# Patient Record
Sex: Male | Born: 1970 | Race: Black or African American | Hispanic: No | Marital: Married | Smoking: Never smoker
Health system: Southern US, Community
[De-identification: ages and names within clinical notes are randomized; demographics above are authoritative.]

## PROBLEM LIST (undated history)

## (undated) DIAGNOSIS — E119 Type 2 diabetes mellitus without complications: Secondary | ICD-10-CM

## (undated) HISTORY — DX: Type 2 diabetes mellitus without complications: E11.9

---

## 1999-03-23 ENCOUNTER — Emergency Department (HOSPITAL_COMMUNITY): Admission: EM | Admit: 1999-03-23 | Discharge: 1999-03-23 | Payer: Self-pay | Admitting: Emergency Medicine

## 2000-04-09 ENCOUNTER — Ambulatory Visit (HOSPITAL_COMMUNITY): Admission: RE | Admit: 2000-04-09 | Discharge: 2000-04-09 | Payer: Self-pay | Admitting: Specialist

## 2000-04-09 ENCOUNTER — Encounter: Payer: Self-pay | Admitting: Specialist

## 2005-11-25 ENCOUNTER — Emergency Department (HOSPITAL_COMMUNITY): Admission: EM | Admit: 2005-11-25 | Discharge: 2005-11-26 | Payer: Self-pay | Admitting: Emergency Medicine

## 2007-06-22 IMAGING — CR DG SHOULDER 2+V*R*
2 series · 2 of 2 positions shown · non-contrast
Comparison: none

CLINICAL DATA: Trauma, shoulder pain.  The patient was unable to perform external rotation view.  
 RIGHT SHOULDER ? 2 VIEW:

[w shoulder ap internal righ]
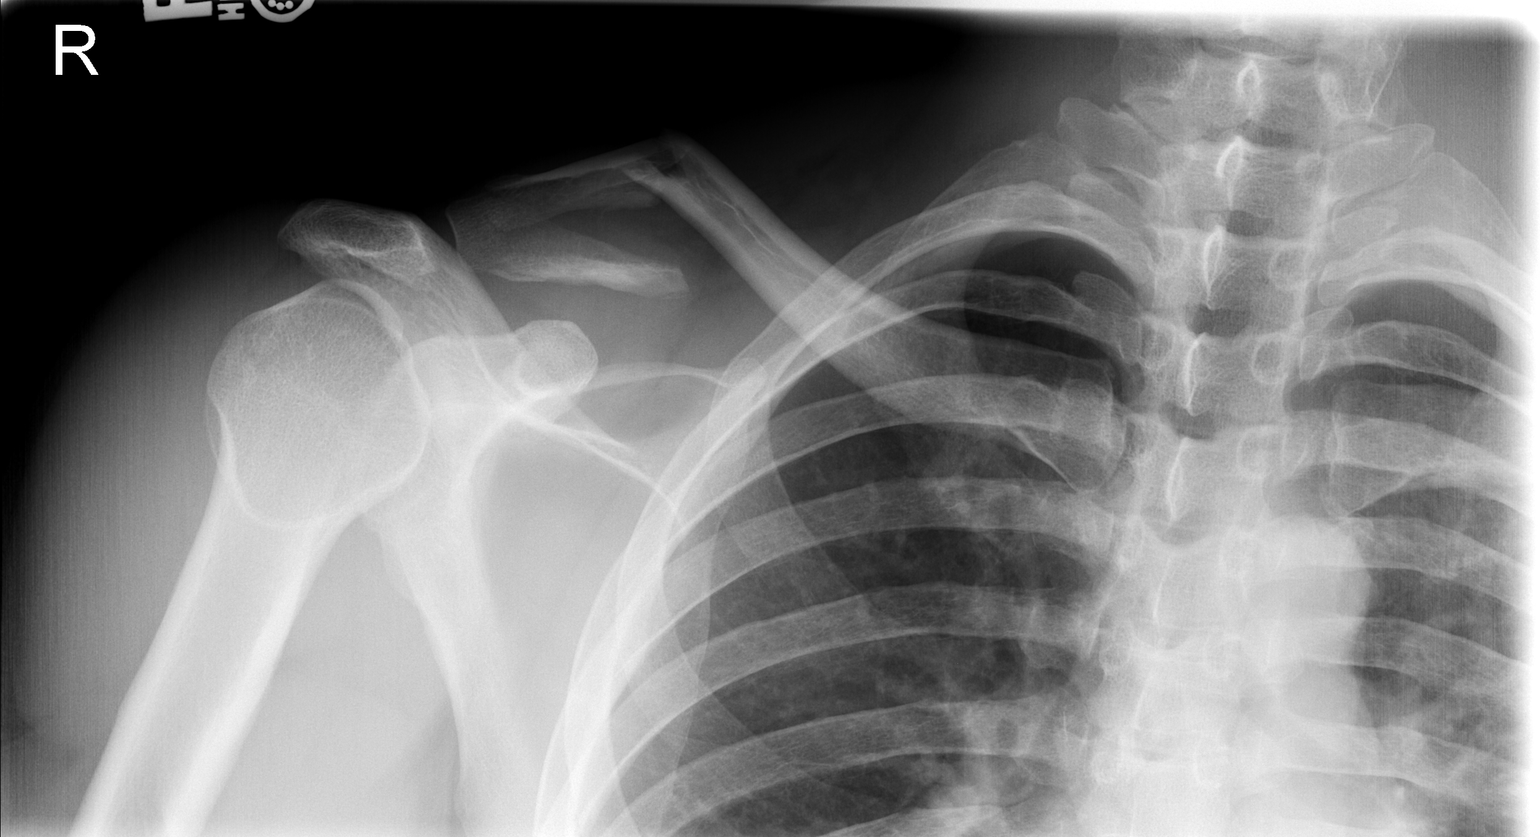

[w shoulder y view right]
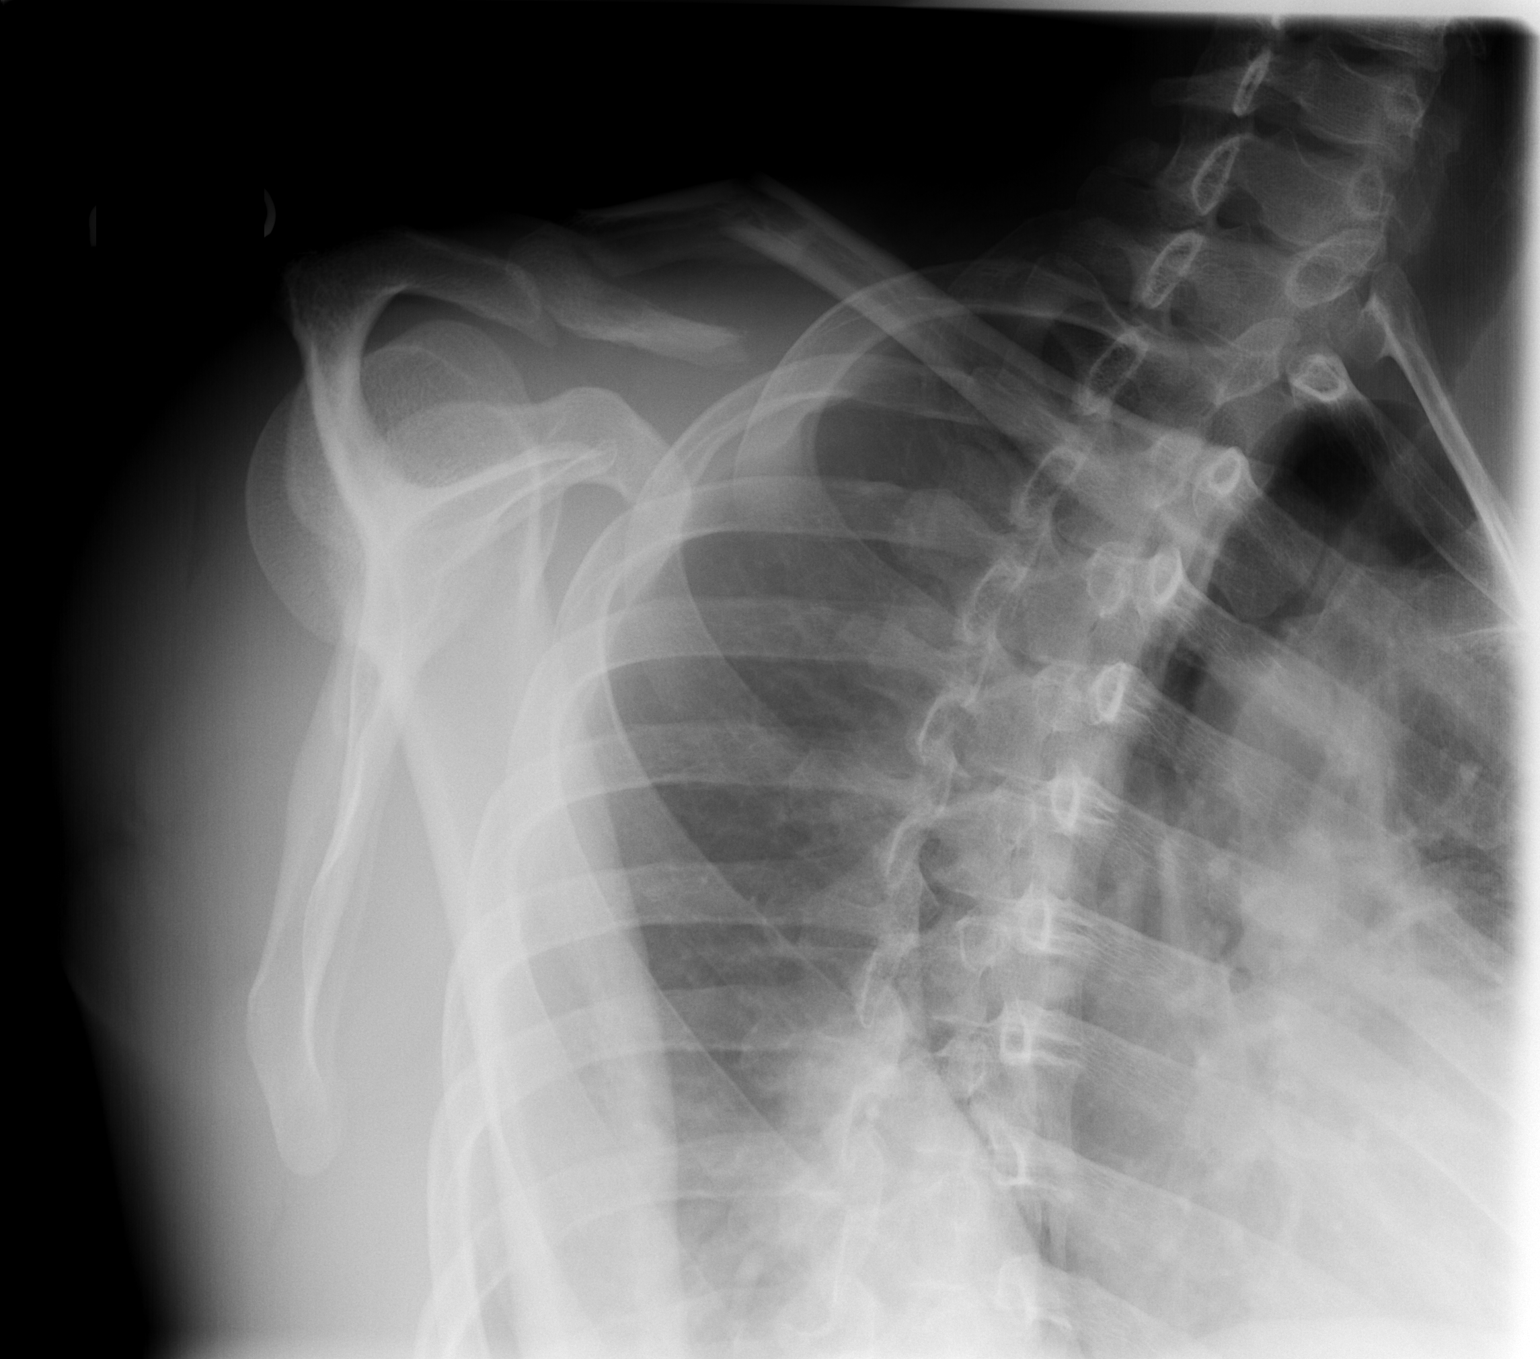

[2 of 2 positions shown; findings below may reference images not displayed]

FINDINGS: There is a distal right clavicular fracture with butterfly fragment noted.  No right apical pneumothorax is seen.  The humeral head is properly located over the glenoid.
IMPRESSION: Distal right clavicular fracture with butterfly fragment.

## 2018-08-05 ENCOUNTER — Ambulatory Visit (INDEPENDENT_AMBULATORY_CARE_PROVIDER_SITE_OTHER): Payer: BLUE CROSS/BLUE SHIELD | Admitting: Family Medicine

## 2018-08-05 ENCOUNTER — Encounter: Payer: Self-pay | Admitting: Family Medicine

## 2018-08-05 VITALS — BP 120/84 | HR 98 | Temp 98.2°F | Resp 12 | Ht 73.0 in | Wt 171.0 lb

## 2018-08-05 DIAGNOSIS — Z23 Encounter for immunization: Secondary | ICD-10-CM

## 2018-08-05 DIAGNOSIS — R634 Abnormal weight loss: Secondary | ICD-10-CM

## 2018-08-05 DIAGNOSIS — Z13220 Encounter for screening for lipoid disorders: Secondary | ICD-10-CM

## 2018-08-05 DIAGNOSIS — E1165 Type 2 diabetes mellitus with hyperglycemia: Secondary | ICD-10-CM | POA: Insufficient documentation

## 2018-08-05 LAB — COMPREHENSIVE METABOLIC PANEL
ALT: 19 U/L (ref 0–53)
AST: 18 U/L (ref 0–37)
Albumin: 4.4 g/dL (ref 3.5–5.2)
Alkaline Phosphatase: 89 U/L (ref 39–117)
BUN: 17 mg/dL (ref 6–23)
CO2: 28 mEq/L (ref 19–32)
Calcium: 9.5 mg/dL (ref 8.4–10.5)
Chloride: 103 mEq/L (ref 96–112)
Creatinine, Ser: 0.95 mg/dL (ref 0.40–1.50)
GFR: 101.69 mL/min (ref >60.00)
Glucose, Bld: 95 mg/dL (ref 70–99)
Potassium: 4.6 mEq/L (ref 3.5–5.1)
Sodium: 137 mEq/L (ref 135–145)
Total Bilirubin: 0.6 mg/dL (ref 0.2–1.2)
Total Protein: 7 g/dL (ref 6.0–8.3)

## 2018-08-05 LAB — HEMOGLOBIN A1C: Hgb A1c MFr Bld: 10.9 % — ABNORMAL HIGH (ref 4.6–6.5)

## 2018-08-05 LAB — LIPID PANEL
Cholesterol: 167 mg/dL (ref 0–200)
HDL: 50.5 mg/dL (ref >39.00)
LDL Cholesterol: 103 mg/dL — ABNORMAL HIGH (ref 0–99)
NonHDL: 116.95
Total CHOL/HDL Ratio: 3
Triglycerides: 70 mg/dL (ref 0.0–149.0)
VLDL: 14 mg/dL (ref 0.0–40.0)

## 2018-08-05 LAB — MICROALBUMIN / CREATININE URINE RATIO
Creatinine,U: 93 mg/dL
Microalb Creat Ratio: 0.8 mg/g (ref 0.0–30.0)
Microalb, Ur: <0.7 mg/dL (ref 0.0–1.9)

## 2018-08-05 MED ORDER — METFORMIN HCL 500 MG PO TABS: 500.0000 mg | ORAL_TABLET | Freq: Two times a day (BID) | ORAL | 3 refills | Status: DC

## 2018-08-05 MED ORDER — DAPAGLIFLOZIN PROPANEDIOL 5 MG PO TABS: 5.0000 mg | ORAL_TABLET | Freq: Every day | ORAL | 3 refills | Status: DC

## 2018-08-05 NOTE — Assessment & Plan Note (Signed)
Newly diagnosed. Educated about diagnosis, prognosis, and treatment options. Reluctant to start new medication, finally he agrees with overall treatment, metformin and [place]. We discussed some side effects of medications. He will continue monitoring BS. Eye exam is current. Follow-up in 3 to 4 months.

## 2018-08-05 NOTE — Progress Notes (Signed)
HPI:   William Rocha is a 48 y.o. male, who is here today to establish care.  Former PCP: N/A Last preventive routine visit: He has not had one in years, recently he had DOT physical. He lives with his wife and 2 children, 77 and 50 years old).  Chronic medical problems: Otherwise healthy until recently when glucose was taken and it was 352. Family history is positive for DM2, parents and sister. He has his DOT physical every 2 years.  When asked about polyuria and polydipsia, he states that he drinks "a lot of" water and therefore he urinates a lot.  States that he drinks about 100 ounces of water per day.  He has noted gradual onset of blurry vision. Last eye exam in 03/2018, history of bilateral nuclear sclerosis.  Abnormal weight loss in the past 6 months, he has lost about 13 pounds.  He is trying to eat healthier, decrease sweet intakes. According to patient, his wife works in the [employer] and has provided some education in regard to diet. He is checking BS, FG 227 and 352.  Bedtime BS 140s to high 200s. He does not exercise regularly.   Review of Systems  Constitutional: Positive for unexpected weight change. Negative for activity change, appetite change, fatigue and fever.  HENT: Negative for nosebleeds and sore throat.   Eyes: Negative for redness and visual disturbance.  Respiratory: Negative for cough, shortness of breath and wheezing.   Cardiovascular: Negative for chest pain, palpitations and leg swelling.  Gastrointestinal: Negative for abdominal pain, nausea and vomiting.  Endocrine: Positive for polydipsia and polyuria. Negative for polyphagia.  Genitourinary: Negative for decreased urine volume, dysuria and hematuria.  Skin: Negative for rash and wound.  Neurological: Negative for weakness, numbness and headaches.    No current outpatient medications on file prior to visit.   No current facility-administered medications on file prior to  visit.    Past Medical History:  Diagnosis Date  . Diabetes mellitus without complication (HCC)    No Known Allergies  Family History  Problem Relation Age of Onset  . Cancer Mother        ? colon  . Diabetes Mother   . Diabetes Father   . Diabetes Sister     Social History   Socioeconomic History  . Marital status: Married    Spouse name: Not on file  . Number of children: Not on file  . Years of education: Not on file  . Highest education level: Not on file  Occupational History  . Not on file  Social Needs  . Financial resource strain: Not on file  . Food insecurity:    Worry: Not on file    Inability: Not on file  . Transportation needs:    Medical: Not on file    Non-medical: Not on file  Tobacco Use  . Smoking status: Never Smoker  . Smokeless tobacco: Never Used  Substance and Sexual Activity  . Alcohol use: Not on file  . Drug use: Not on file  . Sexual activity: Yes  Lifestyle  . Physical activity:    Days per week: Not on file    Minutes per session: Not on file  . Stress: Not on file  Relationships  . Social connections:    Talks on phone: Not on file    Gets together: Not on file    Attends religious service: Not on file    Active member of club  or organization: Not on file    Attends meetings of clubs or organizations: Not on file    Relationship status: Not on file  Other Topics Concern  . Not on file  Social History Narrative  . Not on file    Vitals:   08/05/18 0929  BP: 120/84  Pulse: 98  Resp: 12  Temp: 98.2 F (36.8 C)    Body mass index is 22.56 kg/m.   Physical Exam  Nursing note and vitals reviewed. Constitutional: He is oriented to person, place, and time. He appears well-developed and well-nourished. No distress.  HENT:  Head: Normocephalic and atraumatic.  Mouth/Throat: Oropharynx is clear and moist and mucous membranes are normal.  Eyes: Pupils are equal, round, and reactive to light. Conjunctivae are normal.   Cardiovascular: Normal rate and regular rhythm.  No murmur heard. Pulses:      Dorsalis pedis pulses are 2+ on the right side and 2+ on the left side.  Respiratory: Effort normal and breath sounds normal. No respiratory distress.  GI: Soft. He exhibits no mass. There is no hepatomegaly. There is no abdominal tenderness.  Musculoskeletal:        General: No edema.  Lymphadenopathy:    He has no cervical adenopathy.  Neurological: He is alert and oriented to person, place, and time. He has normal strength. No cranial nerve deficit. Gait normal.  Skin: Skin is warm. No rash noted. No erythema.  Psychiatric: He has a normal mood and affect. Cognition and memory are normal.  Well groomed, good eye contact.   Diabetic Foot Exam - Simple   Simple Foot Form Diabetic Foot exam was performed with the following findings:  Yes 08/05/2018 10:35 AM  Visual Inspection No deformities, no ulcerations, no other skin breakdown bilaterally:  Yes Sensation Testing Intact to touch and monofilament testing bilaterally:  Yes Pulse Check Posterior Tibialis and Dorsalis pulse intact bilaterally:  Yes Comments     ASSESSMENT AND PLAN:  William Rocha was seen today for establish care.  Orders Placed This Encounter  Procedures  . Pneumococcal polysaccharide vaccine 23-valent greater than or equal to 2yo subcutaneous/IM  . Tdap vaccine greater than or equal to 7yo IM  . Comprehensive metabolic panel  . Microalbumin / creatinine urine ratio  . Hemoglobin A1c  . Fructosamine  . Lipid panel    Lab Results  Component Value Date   HGBA1C 10.9 (H) 08/05/2018   Lab Results  Component Value Date   CREATININE 0.95 08/05/2018   BUN 17 08/05/2018   NA 137 08/05/2018   K 4.6 08/05/2018   CL 103 08/05/2018   CO2 28 08/05/2018   Lab Results  Component Value Date   CHOL 167 08/05/2018   HDL 50.50 08/05/2018   LDLCALC 103 (H) 08/05/2018   TRIG 70.0 08/05/2018   CHOLHDL 3 08/05/2018   Lab Results   Component Value Date   MICROALBUR <0.7 08/05/2018   Lab Results  Component Value Date   ALT 19 08/05/2018   AST 18 08/05/2018   ALKPHOS 89 08/05/2018   BILITOT 0.6 08/05/2018     Type 2 diabetes mellitus with hyperglycemia (HCC) Newly diagnosed. Educated about diagnosis, prognosis, and treatment options. Reluctant to start new medication, finally he agrees with overall treatment, metformin and [place]. We discussed some side effects of medications. He will continue monitoring BS. Eye exam is current. Follow-up in 3 to 4 months.  Screening for lipid disorders -     Lipid panel  Abnormal weight  loss Most likely related with poorly controlled diabetes. For now no further work-up recommended. Follow-up in 3 to 4 months.  Need for Tdap vaccination -     Tdap vaccine greater than or equal to 7yo IM  Need for 23-polyvalent pneumococcal polysaccharide vaccine -     Pneumococcal polysaccharide vaccine 23-valent greater than or equal to 2yo subcutaneous/IM    Return in about 4 months (around 12/05/2018) for dm 2.    Tyauna Lacaze G. [place], MD  [facility]. [place] office.

## 2018-08-05 NOTE — Patient Instructions (Addendum)
A few things to remember from today's visit:   Screening for lipid disorders - Plan: Lipid panel  Type 2 diabetes mellitus with hyperglycemia, without long-term current use of insulin (HCC) - Plan: metFORMIN (GLUCOPHAGE) 500 MG tablet, dapagliflozin propanediol (FARXIGA) 5 MG TABS tablet, Comprehensive metabolic panel, Microalbumin / creatinine urine ratio, Hemoglobin A1c, Fructosamine, Lipid panel   Hemoglobin A1c Test Why am I having this test? You may have the hemoglobin A1c test (HbA1c test) done to:  Evaluate your risk for developing diabetes (diabetes mellitus).  Diagnose diabetes.  Monitor long-term control of blood sugar (glucose) in people who have diabetes and help make treatment decisions. This test may be done with other blood glucose tests, such as fasting blood glucose and oral glucose tolerance tests. What is being tested? Hemoglobin is a type of protein in the blood that carries oxygen. Glucose attaches to hemoglobin to form glycated hemoglobin. This test checks the amount of glycated hemoglobin in your blood, which is a good indicator of the average amount of glucose in your blood during the past 2-3 months. What kind of sample is taken?  A blood sample is required for this test. It is usually collected by inserting a needle into a blood vessel. Tell a health care provider about:  All medicines you are taking, including vitamins, herbs, eye drops, creams, and over-the-counter medicines.  Any blood disorders you have.  Any surgeries you have had.  Any medical conditions you have.  Whether you are pregnant or may be pregnant. How are the results reported? Your results will be reported as a percentage that indicates how much of your hemoglobin has glucose attached to it (is glycated). Your health care provider will compare your results to normal ranges that were established after testing a large group of people (reference ranges). Reference ranges may vary among labs  and hospitals. For this test, common reference ranges are:  Adult or child without diabetes: 4-5.6%.  Adult or child with diabetes and good blood glucose control: less than 7%. What do the results mean? If you have diabetes:  A result of less than 7% is considered normal, meaning that your blood glucose is well controlled.  A result higher than 7% means that your blood glucose is not well controlled, and your treatment plan may need to be adjusted. If you do not have diabetes:  A result within the reference range is considered normal, meaning that you are not at high risk for diabetes.  A result of 5.7-6.4% means that you have a high risk of developing diabetes, and you may have prediabetes. Prediabetes is the condition of having a blood glucose level that is higher than it should be, but not high enough for you to be diagnosed with diabetes. Having prediabetes puts you at risk for developing type 2 diabetes (type 2 diabetes mellitus). You may have more tests, including a repeat HbA1c test.  Results of 6.5% or higher on two separate HbA1c tests mean that you have diabetes. You may have more tests to confirm the diagnosis. Abnormally low HbA1c values may be caused by:  Pregnancy.  Severe blood loss.  Receiving donated blood (transfusions).  Low red blood cell count (anemia).  Long-term kidney failure.  Some unusual forms (variants) of hemoglobin. Talk with your health care provider about what your results mean. Questions to ask your health care provider Ask your health care provider, or the department that is doing the test:  When will my results be ready?  How will  I get my results?  What are my treatment options?  What other tests do I need?  What are my next steps? Summary  The hemoglobin A1c test (HbA1c test) may be done to evaluate your risk for developing diabetes, to diagnose diabetes, and to monitor long-term control of blood sugar (glucose) in people who have  diabetes and help make treatment decisions.  Hemoglobin is a type of protein in the blood that carries oxygen. Glucose attaches to hemoglobin to form glycated hemoglobin. This test checks the amount of glycated hemoglobin in your blood, which is a good indicator of the average amount of glucose in your blood during the past 2-3 months.  Talk with your health care provider about what your results mean. This information is not intended to replace advice given to you by your health care provider. Make sure you discuss any questions you have with your health care provider. Document Released: 05/23/2004 Document Revised: 12/12/2016 Document Reviewed: 12/12/2016 Elsevier Interactive Patient Education  2019 [employer].  Please be sure medication list is accurate. If a new problem present, please set up appointment sooner than planned today.

## 2018-08-07 ENCOUNTER — Other Ambulatory Visit: Payer: Self-pay | Admitting: *Deleted

## 2018-08-07 LAB — FRUCTOSAMINE: Fructosamine: 466 umol/L — ABNORMAL HIGH (ref 205–285)

## 2018-08-07 MED ORDER — ATORVASTATIN CALCIUM 10 MG PO TABS: 10.0000 mg | ORAL_TABLET | Freq: Every day | ORAL | 3 refills | Status: DC

## 2018-09-03 ENCOUNTER — Encounter: Payer: Self-pay | Admitting: Family Medicine

## 2018-09-09 ENCOUNTER — Encounter: Payer: Self-pay | Admitting: Family Medicine

## 2018-09-11 ENCOUNTER — Encounter: Payer: Self-pay | Admitting: Family Medicine

## 2018-09-17 ENCOUNTER — Other Ambulatory Visit: Payer: Self-pay | Admitting: *Deleted

## 2018-09-17 DIAGNOSIS — E1165 Type 2 diabetes mellitus with hyperglycemia: Secondary | ICD-10-CM

## 2018-09-20 ENCOUNTER — Other Ambulatory Visit (INDEPENDENT_AMBULATORY_CARE_PROVIDER_SITE_OTHER): Payer: BLUE CROSS/BLUE SHIELD

## 2018-09-20 ENCOUNTER — Other Ambulatory Visit: Payer: Self-pay

## 2018-09-20 DIAGNOSIS — E1165 Type 2 diabetes mellitus with hyperglycemia: Secondary | ICD-10-CM | POA: Diagnosis not present

## 2018-09-20 LAB — HEMOGLOBIN A1C: Hgb A1c MFr Bld: 7.2 % — ABNORMAL HIGH (ref 4.6–6.5)

## 2018-09-26 ENCOUNTER — Other Ambulatory Visit: Payer: Self-pay | Admitting: *Deleted

## 2018-09-26 DIAGNOSIS — E1165 Type 2 diabetes mellitus with hyperglycemia: Secondary | ICD-10-CM

## 2018-09-26 MED ORDER — METFORMIN HCL 500 MG PO TABS: 500.0000 mg | ORAL_TABLET | Freq: Two times a day (BID) | ORAL | 3 refills | Status: DC

## 2018-09-26 MED ORDER — DAPAGLIFLOZIN PROPANEDIOL 5 MG PO TABS: 5.0000 mg | ORAL_TABLET | Freq: Every day | ORAL | 3 refills | Status: DC

## 2018-09-27 ENCOUNTER — Other Ambulatory Visit: Payer: Self-pay | Admitting: *Deleted

## 2018-09-27 DIAGNOSIS — E1165 Type 2 diabetes mellitus with hyperglycemia: Secondary | ICD-10-CM

## 2018-09-27 MED ORDER — DAPAGLIFLOZIN PROPANEDIOL 5 MG PO TABS: 5.0000 mg | ORAL_TABLET | Freq: Every day | ORAL | 0 refills | Status: DC

## 2018-09-27 MED ORDER — METFORMIN HCL 500 MG PO TABS: 500.0000 mg | ORAL_TABLET | Freq: Two times a day (BID) | ORAL | 1 refills | Status: DC

## 2018-10-11 ENCOUNTER — Other Ambulatory Visit: Payer: Self-pay | Admitting: *Deleted

## 2018-10-11 DIAGNOSIS — E1165 Type 2 diabetes mellitus with hyperglycemia: Secondary | ICD-10-CM

## 2018-10-11 MED ORDER — METFORMIN HCL 500 MG PO TABS: 500.0000 mg | ORAL_TABLET | Freq: Two times a day (BID) | ORAL | 1 refills | Status: DC

## 2018-12-13 ENCOUNTER — Ambulatory Visit (INDEPENDENT_AMBULATORY_CARE_PROVIDER_SITE_OTHER): Payer: BC Managed Care – PPO | Admitting: Family Medicine

## 2018-12-13 ENCOUNTER — Encounter: Payer: Self-pay | Admitting: Family Medicine

## 2018-12-13 ENCOUNTER — Other Ambulatory Visit: Payer: Self-pay

## 2018-12-13 VITALS — BP 140/90 | HR 84 | Temp 98.3°F | Resp 12 | Wt 176.6 lb

## 2018-12-13 DIAGNOSIS — E785 Hyperlipidemia, unspecified: Secondary | ICD-10-CM | POA: Diagnosis not present

## 2018-12-13 DIAGNOSIS — E1165 Type 2 diabetes mellitus with hyperglycemia: Secondary | ICD-10-CM

## 2018-12-13 DIAGNOSIS — E1169 Type 2 diabetes mellitus with other specified complication: Secondary | ICD-10-CM | POA: Diagnosis not present

## 2018-12-13 MED ORDER — METFORMIN HCL 500 MG PO TABS: 500.0000 mg | ORAL_TABLET | Freq: Two times a day (BID) | ORAL | 1 refills | Status: DC

## 2018-12-13 MED ORDER — FARXIGA 5 MG PO TABS: 5.0000 mg | ORAL_TABLET | Freq: Every day | ORAL | 1 refills | Status: AC

## 2018-12-13 NOTE — Assessment & Plan Note (Signed)
Educated about benefits of statin medications and some side effect. He agrees with trying Atorvastatin 10 mg daily and low fat diet.

## 2018-12-13 NOTE — Progress Notes (Signed)
HPI:   WilliamArkeem Clevland Rocha is a 48 y.o. male, who is here today for chronic disease management. He had lab work recently. Last f/u visit 08/05/18. No new problems since his last OV.  DM II,he is on Metformin 500 mg bid and Farxiga 5 mg daily. He has tolerated medication well. BS's ,fasting and post prandial 100-120's. Denies hypoglycemic events.  Denies abdominal pain, nausea,vomiting, polydipsia,polyuria, or polyphagia.  He decreased sweets intake. Running and walking almost daily.  States that he is eating "a lot of fruit" daily. In a day he eats watermelon 2-3 cups,apples x 2,oranges x 2,and cherries.   Lab Results  Component Value Date   HGBA1C 7.2 (H) 09/20/2018   HLD,he has not started Atorvastatin 10 mg as recommended. He is following low fat diet. Denies severe/frequent headache, visual changes, chest pain, dyspnea, palpitation, claudication, focal weakness, or edema.  Lab Results  Component Value Date   CHOL 167 08/05/2018   HDL 50.50 08/05/2018   LDLCALC 103 (H) 08/05/2018   TRIG 70.0 08/05/2018   CHOLHDL 3 08/05/2018    Review of Systems  Constitutional: Negative for activity change, appetite change, fatigue and fever.  HENT: Negative for nosebleeds, sore throat and trouble swallowing.   Eyes: Negative for redness and visual disturbance.  Respiratory: Negative for cough and wheezing.   Gastrointestinal: Negative for nausea and vomiting.       No changes in bowel habits.  Genitourinary: Negative for decreased urine volume, dysuria and hematuria.  Musculoskeletal: Negative for joint swelling and myalgias.  Skin: Negative for rash and wound.  Neurological: Negative for syncope and numbness.  Rest see pertinent positives and negatives per HPI.   Current Outpatient Medications on File Prior to Visit  Medication Sig Dispense Refill  . atorvastatin (LIPITOR) 10 MG tablet Take 1 tablet (10 mg total) by mouth daily. 90 tablet 3   No current  facility-administered medications on file prior to visit.      Past Medical History:  Diagnosis Date  . Diabetes mellitus without complication ([place])    No Known Allergies  Social History   Socioeconomic History  . Marital status: Married    Spouse name: Not on file  . Number of children: Not on file  . Years of education: Not on file  . Highest education level: Not on file  Occupational History  . Not on file  Social Needs  . Financial resource strain: Not on file  . Food insecurity    Worry: Not on file    Inability: Not on file  . Transportation needs    Medical: Not on file    Non-medical: Not on file  Tobacco Use  . Smoking status: Never Smoker  . Smokeless tobacco: Never Used  Substance and Sexual Activity  . Alcohol use: Not on file  . Drug use: Not on file  . Sexual activity: Yes  Lifestyle  . Physical activity    Days per week: Not on file    Minutes per session: Not on file  . Stress: Not on file  Relationships  . Social Herbalist on phone: Not on file    Gets together: Not on file    Attends religious service: Not on file    Active member of club or organization: Not on file    Attends meetings of clubs or organizations: Not on file    Relationship status: Not on file  Other Topics Concern  . Not on  file  Social History Narrative  . Not on file    Vitals:   12/13/18 0846  BP: 140/90  Pulse: 84  Resp: 12  Temp: 98.3 F (36.8 C)  SpO2: 97%   Body mass index is 23.3 kg/m.    Physical Exam  Nursing note and vitals reviewed. Constitutional: He is oriented to person, place, and time. He appears well-developed and well-nourished. No distress.  HENT:  Head: Normocephalic and atraumatic.  Mouth/Throat: Oropharynx is clear and moist and mucous membranes are normal.  Eyes: Pupils are equal, round, and reactive to light. Conjunctivae are normal.  Cardiovascular: Normal rate and regular rhythm.  No murmur heard. Pulses:       Dorsalis pedis pulses are 2+ on the right side and 2+ on the left side.  Respiratory: Effort normal and breath sounds normal. No respiratory distress.  GI: Soft. He exhibits no mass. There is no hepatomegaly. There is no abdominal tenderness.  Musculoskeletal:        General: No edema.  Lymphadenopathy:    He has no cervical adenopathy.  Neurological: He is alert and oriented to person, place, and time. He has normal strength. No cranial nerve deficit. Gait normal.  Skin: Skin is warm. No rash noted. No erythema.  Psychiatric: He has a normal mood and affect. Cognition and memory are normal.  Well groomed, good eye contact.    ASSESSMENT AND PLAN:  Mr. Lollie SailsHarry was seen today for follow-up.  Diagnoses and all orders for this visit:   Type 2 diabetes mellitus with hyperglycemia (HCC) HgA1C above goal. No changes in current management, just adjusted. Regular exercise and healthy diet with avoidance of added sugar food intake is an important part of treatment and recommended. Annual eye exam, periodic dental and foot care recommended. F/U in 5-6 months   Hyperlipidemia associated with type 2 diabetes mellitus (HCC) Educated about benefits of statin medications and some side effect. He agrees with trying Atorvastatin 10 mg daily and low fat diet.   Return in about 5 months (around 05/15/2019) for CPE.    -Mr. Billie LadeHarry Leon Hanford was advised to return sooner than planned today if new concerns arise.       Kaikoa Magro G. [place], MD  [facility]. [place] office.

## 2018-12-13 NOTE — Patient Instructions (Addendum)
A few things to remember from today's visit:   Type 2 diabetes mellitus with hyperglycemia, without long-term current use of insulin ([place])  Hyperlipidemia associated with type 2 diabetes mellitus ([place])  HgA1C goal < 7.0. Avoid sugar added food:regular soft drinks, energy drinks, and sports drinks. candy. cakes. cookies. pies and cobblers. sweet rolls, pastries, and donuts. fruit drinks, such as fruitades and fruit punch. dairy desserts, such as ice cream  Mediterranean diet has showed benefits for sugar control.  How much and what type of carbohydrate foods are important for managing diabetes. The balance between how much insulin is in your body and the carbohydrate you eat makes a difference in your blood glucose levels.  Fasting blood sugar ideally 130 or less, 2 hours after meals less than 180.   Regular exercise also will help with controlling disease, daily brisk walking as tolerated for 15-30 min definitively will help.   Avoid skipping meals, blood sugar might drop and cause serious problems. Remember checking feet periodically, good dental hygiene, and annual eye exam.     Please be sure medication list is accurate. If a new problem present, please set up appointment sooner than planned today.

## 2018-12-13 NOTE — Assessment & Plan Note (Signed)
HgA1C above goal. No changes in current management, just adjusted. Regular exercise and healthy diet with avoidance of added sugar food intake is an important part of treatment and recommended. Annual eye exam, periodic dental and foot care recommended. F/U in 5-6 months

## 2019-05-19 ENCOUNTER — Encounter: Payer: BC Managed Care – PPO | Admitting: Family Medicine

## 2019-06-19 ENCOUNTER — Other Ambulatory Visit: Payer: Self-pay | Admitting: Orthopedic Surgery

## 2019-06-19 DIAGNOSIS — S63408A Traumatic rupture of unspecified ligament of other finger at metacarpophalangeal and interphalangeal joint, initial encounter: Secondary | ICD-10-CM

## 2019-06-20 ENCOUNTER — Other Ambulatory Visit: Payer: Self-pay

## 2019-06-20 ENCOUNTER — Ambulatory Visit: Payer: BC Managed Care – PPO | Admitting: Family Medicine

## 2019-06-20 ENCOUNTER — Encounter: Payer: Self-pay | Admitting: Family Medicine

## 2019-06-20 VITALS — BP 130/90 | HR 84 | Temp 96.5°F | Resp 12 | Ht 73.0 in | Wt 177.0 lb

## 2019-06-20 DIAGNOSIS — R030 Elevated blood-pressure reading, without diagnosis of hypertension: Secondary | ICD-10-CM | POA: Diagnosis not present

## 2019-06-20 DIAGNOSIS — E1169 Type 2 diabetes mellitus with other specified complication: Secondary | ICD-10-CM

## 2019-06-20 DIAGNOSIS — E785 Hyperlipidemia, unspecified: Secondary | ICD-10-CM | POA: Diagnosis not present

## 2019-06-20 DIAGNOSIS — E1165 Type 2 diabetes mellitus with hyperglycemia: Secondary | ICD-10-CM

## 2019-06-20 DIAGNOSIS — Z23 Encounter for immunization: Secondary | ICD-10-CM | POA: Diagnosis not present

## 2019-06-20 DIAGNOSIS — B351 Tinea unguium: Secondary | ICD-10-CM

## 2019-06-20 LAB — COMPREHENSIVE METABOLIC PANEL
ALT: 17 U/L (ref 0–53)
AST: 17 U/L (ref 0–37)
Albumin: 4.6 g/dL (ref 3.5–5.2)
Alkaline Phosphatase: 67 U/L (ref 39–117)
BUN: 17 mg/dL (ref 6–23)
CO2: 26 mEq/L (ref 19–32)
Calcium: 9.6 mg/dL (ref 8.4–10.5)
Chloride: 103 mEq/L (ref 96–112)
Creatinine, Ser: 1.22 mg/dL (ref 0.40–1.50)
GFR: 75.92 mL/min (ref >60.00)
Glucose, Bld: 125 mg/dL — ABNORMAL HIGH (ref 70–99)
Potassium: 4.3 mEq/L (ref 3.5–5.1)
Sodium: 138 mEq/L (ref 135–145)
Total Bilirubin: 0.6 mg/dL (ref 0.2–1.2)
Total Protein: 7.3 g/dL (ref 6.0–8.3)

## 2019-06-20 LAB — POCT GLYCOSYLATED HEMOGLOBIN (HGB A1C): Hemoglobin A1C: 5.9 % — AB (ref 4.0–5.6)

## 2019-06-20 LAB — LIPID PANEL
Cholesterol: 179 mg/dL (ref 0–200)
HDL: 42.5 mg/dL (ref >39.00)
LDL Cholesterol: 114 mg/dL — ABNORMAL HIGH (ref 0–99)
NonHDL: 136.73
Total CHOL/HDL Ratio: 4
Triglycerides: 113 mg/dL (ref 0.0–149.0)
VLDL: 22.6 mg/dL (ref 0.0–40.0)

## 2019-06-20 LAB — MICROALBUMIN / CREATININE URINE RATIO
Creatinine,U: 106.5 mg/dL
Microalb Creat Ratio: 0.7 mg/g (ref 0.0–30.0)
Microalb, Ur: <0.7 mg/dL (ref 0.0–1.9)

## 2019-06-20 MED ORDER — PRAVASTATIN SODIUM 20 MG PO TABS: 20.0000 mg | ORAL_TABLET | Freq: Every day | ORAL | 1 refills | Status: DC

## 2019-06-20 NOTE — Progress Notes (Signed)
HPI:   William Rocha is a 49 y.o. male, who is here today for 6 months follow up.   He was last seen on 12/13/18. No new problems since his last visit.  DM II: He is on non pharmacologic treatment. 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 He acknowledges that he also brings a piece of cake but he does not eat it frequently.  Lab Results  Component Value Date   HGBA1C 7.2 (H) 09/20/2018   He does not check BS"s not frequent.   Lab Results  Component Value Date   MICROALBUR <0.7 08/05/2018   Denies abdominal pain, nausea,vomiting, polydipsia,polyuria, or polyphagia.  -C/O toenail changes for a while. No periungual edema or erythema. He has had athlete foot. He has not used OTC medications.  States that BP was elevated during orthopedist appointment, 139/73. He is not checking BP at home. Negative for severe/frequent headache, visual changes, chest pain, dyspnea, palpitation, claudication, focal weakness, or edema.  Lab Results  Component Value Date   CREATININE 0.95 08/05/2018   BUN 17 08/05/2018   NA 137 08/05/2018   K 4.6 08/05/2018   CL 103 08/05/2018   CO2 28 08/05/2018    Hyperlipidemia: He has not been taking atorvastatin daily, discontinued a few weeks ago because she was feeling some chest tightness when taking medication.  Problem resolved after stopping medication. He is following low-fat diet.  Lab Results  Component Value Date   CHOL 167 08/05/2018   HDL 50.50 08/05/2018   LDLCALC 103 (H) 08/05/2018   TRIG 70.0 08/05/2018   CHOLHDL 3 08/05/2018    Review of Systems  Constitutional: Negative for activity change, appetite change, fatigue and fever.  HENT: Negative for mouth sores, nosebleeds and sore throat.   Respiratory: Negative for cough and wheezing.   Gastrointestinal:       No changes in bowel movements.  Genitourinary: Negative for decreased urine volume, dysuria and hematuria.  Musculoskeletal:  Negative for gait problem and myalgias.  Skin: Negative for rash and wound.  Neurological: Negative for syncope, facial asymmetry, speech difficulty and numbness.  Rest of ROS, see pertinent positives sand negatives in HPI   Current Outpatient Medications on File Prior to Visit  Medication Sig Dispense Refill  . FARXIGA 5 MG TABS tablet Take 5 mg by mouth daily.    . metFORMIN (GLUCOPHAGE) 500 MG tablet Take 1 tablet (500 mg total) by mouth 2 (two) times daily with a meal. 90 tablet 1   No current facility-administered medications on file prior to visit.    Past Medical History:  Diagnosis Date  . Diabetes mellitus without complication (HCC)    No Known Allergies  Social History   Socioeconomic History  . Marital status: Married    Spouse name: Not on file  . Number of children: Not on file  . Years of education: Not on file  . Highest education level: Not on file  Occupational History  . Not on file  Tobacco Use  . Smoking status: Never Smoker  . Smokeless tobacco: Never Used  Substance and Sexual Activity  . Alcohol use: Not on file  . Drug use: Not on file  . Sexual activity: Yes  Other Topics Concern  . Not on file  Social History Narrative  . Not on file   Social Determinants of Health   Financial Resource Strain:   . Difficulty of Paying Living Expenses: Not on file  Food Insecurity:   . Worried About Programme researcher, broadcasting/film/video in the Last Year: Not on file  . Ran Out of Food in the Last Year: Not on file  Transportation Needs:   . Lack of Transportation (Medical): Not on file  . Lack of Transportation (Non-Medical): Not on file  Physical Activity:   . Days of Exercise per Week: Not on file  . Minutes of Exercise per Session: Not on file  Stress:   . Feeling of Stress : Not on file  Social Connections:   . Frequency of Communication with Friends and Family: Not on file  . Frequency of Social Gatherings with Friends and Family: Not on file  . Attends  Religious Services: Not on file  . Active Member of Clubs or Organizations: Not on file  . Attends Banker Meetings: Not on file  . Marital Status: Not on file    Vitals:   06/20/19 0930  BP: 130/90  Pulse: 84  Resp: 12  Temp: (!) 96.5 F (35.8 C)  SpO2: 98%   Wt Readings from Last 3 Encounters:  06/20/19 177 lb (80.3 kg)  12/13/18 176 lb 9.6 oz (80.1 kg)  08/05/18 171 lb (77.6 kg)    Body mass index is 23.35 kg/m.   Physical Exam  Nursing note reviewed. Constitutional: He is oriented to person, place, and time. He appears well-developed and well-nourished. No distress.  HENT:  Head: Normocephalic and atraumatic.  Mouth/Throat: Oropharynx is clear and moist and mucous membranes are normal.  Eyes: Pupils are equal, round, and reactive to light. Conjunctivae are normal.  Cardiovascular: Normal rate and regular rhythm.  No murmur heard. Pulses:      Dorsalis pedis pulses are 2+ on the right side and 2+ on the left side.  Respiratory: Effort normal and breath sounds normal. No respiratory distress.  GI: Soft. He exhibits no mass. There is no hepatomegaly. There is no abdominal tenderness.  Musculoskeletal:        General: No edema.  Lymphadenopathy:    He has no cervical adenopathy.  Neurological: He is alert and oriented to person, place, and time. He has normal strength. No cranial nerve deficit. Gait normal.  Skin: Skin is warm. No rash noted. No erythema.  Bilateral great toenail with corner of toenail detached from bed nail, subungual debris.No periungual edema or erythema.  Psychiatric: He has a normal mood and affect. Cognition and memory are normal.  Well groomed, good eye contact.    ASSESSMENT AND PLAN:   William Rocha was seen today for 6 months follow-up.  Orders Placed This Encounter  Procedures  . Flu Vaccine QUAD 36+ mos IM  . Microalbumin / creatinine urine ratio  . Fructosamine  . Lipid panel  . Comprehensive metabolic panel   . POC HgB A1c   Lab Results  Component Value Date   HGBA1C 5.9 (A) 06/20/2019   Lab Results  Component Value Date   CHOL 179 06/20/2019   HDL 42.50 06/20/2019   LDLCALC 114 (H) 06/20/2019   TRIG 113.0 06/20/2019   CHOLHDL 4 06/20/2019   Lab Results  Component Value Date   MICROALBUR <0.7 06/20/2019   MICROALBUR <0.7 08/05/2018    Lab Results  Component Value Date   CREATININE 1.22 06/20/2019   BUN 17 06/20/2019   NA 138 06/20/2019   K 4.3 06/20/2019   CL 103 06/20/2019   CO2 26 06/20/2019   Lab Results  Component Value Date  ALT 17 06/20/2019   AST 17 06/20/2019   ALKPHOS 67 06/20/2019   BILITOT 0.6 06/20/2019     Type 2 diabetes mellitus with hyperglycemia (HCC) HgA1C at goal. No changes in current management. Regular exercise and healthy diet with avoidance of added sugar food intake is an important part of treatment and recommended. Annual eye exam, periodic dental and foot care recommended. F/U in 5-6 months   Hyperlipidemia associated with type 2 diabetes mellitus ([place]) We discussed some side effects of statin medication. He agrees with changing atorvastatin for pravastatin 20 mg daily. Low fat diet to continue.   Onychomycosis of great toe Educated about Dx and prognosis. I do not recommend oral antimedic of the medication. Recommend OTC Vick vapor rub on toenail every night.  Need for influenza vaccination - Flu Vaccine QUAD 36+ mos IM  Elevated blood pressure reading Recommend monitoring BP at home. Goal =<130/80. Low-salt diet recommended.   Return in about 6 months (around 12/18/2019) for CPE.    Kirstina Leinweber G. [place], MD  [facility]. [place] office.

## 2019-06-20 NOTE — Patient Instructions (Signed)
A few things to remember from today's visit:   Type 2 diabetes mellitus with hyperglycemia, without long-term current use of insulin (HCC) - Plan: POC HgB A1c, Microalbumin / creatinine urine ratio, Fructosamine, Comprehensive metabolic panel  Hyperlipidemia associated with type 2 diabetes mellitus (HCC) - Plan: Lipid panel, Comprehensive metabolic panel, pravastatin (PRAVACHOL) 20 MG tablet  Today we changed cholesterol medication, Pravastatin.  Please be sure medication list is accurate. If a new problem present, please set up appointment sooner than planned today.

## 2019-06-20 NOTE — Assessment & Plan Note (Addendum)
We discussed some side effects of statin medication. He agrees with changing atorvastatin for pravastatin 20 mg daily. Low fat diet to continue.

## 2019-06-20 NOTE — Assessment & Plan Note (Signed)
HgA1C at goal. No changes in current management. Regular exercise and healthy diet with avoidance of added sugar food intake is an important part of treatment and recommended. Annual eye exam, periodic dental and foot care recommended. F/U in 5-6 months  

## 2019-06-24 LAB — FRUCTOSAMINE: Fructosamine: 305 umol/L — ABNORMAL HIGH (ref 205–285)

## 2019-07-14 ENCOUNTER — Ambulatory Visit
Admission: RE | Admit: 2019-07-14 | Discharge: 2019-07-14 | Disposition: A | Discharge status: Home / Self Care | Payer: BC Managed Care – PPO | Source: Ambulatory Visit | Attending: Orthopedic Surgery | Admitting: Orthopedic Surgery

## 2019-07-14 ENCOUNTER — Other Ambulatory Visit: Payer: Self-pay

## 2019-07-14 DIAGNOSIS — S63408A Traumatic rupture of unspecified ligament of other finger at metacarpophalangeal and interphalangeal joint, initial encounter: Secondary | ICD-10-CM

## 2019-11-23 ENCOUNTER — Other Ambulatory Visit: Payer: Self-pay | Admitting: Family Medicine

## 2019-11-23 DIAGNOSIS — E1165 Type 2 diabetes mellitus with hyperglycemia: Secondary | ICD-10-CM

## 2019-12-19 ENCOUNTER — Encounter: Payer: Self-pay | Admitting: Family Medicine

## 2019-12-19 ENCOUNTER — Other Ambulatory Visit: Payer: Self-pay

## 2019-12-19 ENCOUNTER — Ambulatory Visit: Payer: BC Managed Care – PPO | Admitting: Family Medicine

## 2019-12-19 VITALS — BP 118/72 | HR 93 | Temp 98.7°F | Resp 12 | Ht 73.0 in | Wt 177.2 lb

## 2019-12-19 DIAGNOSIS — E785 Hyperlipidemia, unspecified: Secondary | ICD-10-CM | POA: Diagnosis not present

## 2019-12-19 DIAGNOSIS — E1165 Type 2 diabetes mellitus with hyperglycemia: Secondary | ICD-10-CM

## 2019-12-19 DIAGNOSIS — E1169 Type 2 diabetes mellitus with other specified complication: Secondary | ICD-10-CM

## 2019-12-19 LAB — POCT GLYCOSYLATED HEMOGLOBIN (HGB A1C): Hemoglobin A1C: 5.8 % — AB (ref 4.0–5.6)

## 2019-12-19 NOTE — Assessment & Plan Note (Signed)
HgA1C at goal, still < 6.0, so he agrees with stopping [place]. Continue Metformin 500 mg bid.  Regular exercise and healthy diet with avoidance of added sugar food intake is an important part of treatment and recommended. Annual eye exam (needed), periodic dental and foot care recommended. F/U in 4 months

## 2019-12-19 NOTE — Patient Instructions (Signed)
A few things to remember from today's visit:   Type 2 diabetes mellitus with hyperglycemia, without long-term current use of insulin (HCC) - Plan: POC HgB A1c  Hyperlipidemia associated with type 2 diabetes mellitus (HCC)  Try to take Pravastatin daily. Today farxiga was discontinued. Continue Metformin with food.  If you need refills please call your pharmacy. Do not use My Chart to request refills or for acute issues that need immediate attention.    Please be sure medication list is accurate. If a new problem present, please set up appointment sooner than planned today.

## 2019-12-19 NOTE — Progress Notes (Signed)
HPI: [name] is a 49 y.o. male, who is here today for 6 months follow up.   He was last seen on 06/20/2019. No new problems since her last visit. DM2: Diagnosed in 07/2018. Currently he is on Farxiga 5 mg daily and Metformin 500 mg twice daily. In general he is tolerating medication well, some diarrhea when he does not take Metformin with food.  He is not checking BS's regularly.  Negative for abdominal pain, nausea,vomiting, polydipsia,polyuria, or polyphagia.  Lab Results  Component Value Date   MICROALBUR <0.7 06/20/2019   MICROALBUR <0.7 08/05/2018    Lab Results  Component Value Date   CREATININE 1.22 06/20/2019   BUN 17 06/20/2019   NA 138 06/20/2019   K 4.3 06/20/2019   CL 103 06/20/2019   CO2 26 06/20/2019   He has not had his eye exam.  HLD: Since his last visit he has taken Pravastatin 2-3 times. No side effects but he forgets to take medication.  In general he follows a healthful diet, he knows what to avoid. Exercising some.  Lab Results  Component Value Date   CHOL 179 06/20/2019   HDL 42.50 06/20/2019   LDLCALC 114 (H) 06/20/2019   TRIG 113.0 06/20/2019   CHOLHDL 4 06/20/2019   Review of Systems  Constitutional: Negative for activity change, appetite change, fatigue and fever.  HENT: Negative for mouth sores, nosebleeds and sore throat.   Eyes: Negative for redness and visual disturbance.  Respiratory: Negative for cough, shortness of breath and wheezing.   Cardiovascular: Negative for chest pain, palpitations and leg swelling.  Gastrointestinal: Negative for vomiting.  Genitourinary: Negative for decreased urine volume, dysuria and hematuria.  Neurological: Negative for weakness, numbness and headaches.  Rest of ROS, see pertinent positives sand negatives in HPI  Current Outpatient Medications on File Prior to Visit  Medication Sig Dispense Refill   pravastatin (PRAVACHOL) 20 MG tablet Take 1 tablet (20 mg total) by mouth  daily. 90 tablet 1   metFORMIN (GLUCOPHAGE) 500 MG tablet Take 1 tablet (500 mg total) by mouth 2 (two) times daily with a meal. 90 tablet 1   No current facility-administered medications on file prior to visit.   Past Medical History:  Diagnosis Date   Diabetes mellitus without complication (HCC)    No Known Allergies  Social History   Socioeconomic History   Marital status: Married    Spouse name: Not on file   Number of children: Not on file   Years of education: Not on file   Highest education level: Not on file  Occupational History   Not on file  Tobacco Use   Smoking status: Never Smoker   Smokeless tobacco: Never Used  Substance and Sexual Activity   Alcohol use: Not on file   Drug use: Not on file   Sexual activity: Yes  Other Topics Concern   Not on file  Social History Narrative   Not on file   Social Determinants of Health   Financial Resource Strain:    Difficulty of Paying Living Expenses:   Food Insecurity:    Worried About Running Out of Food in the Last Year:    Barista in the Last Year:   Transportation Needs:    Freight forwarder (Medical):    Lack of Transportation (Non-Medical):   Physical Activity:    Days of Exercise per Week:    Minutes of Exercise per Session:   Stress:  Feeling of Stress :   Social Connections:    Frequency of Communication with Friends and Family:    Frequency of Social Gatherings with Friends and Family:    Attends Religious Services:    Active Member of Clubs or Organizations:    Attends Banker Meetings:    Marital Status:    Vitals:   12/19/19 0924  BP: 118/72  Pulse: 93  Resp: 12  Temp: 98.7 F (37.1 C)  SpO2: 96%   Body mass index is 23.39 kg/m.  Physical Exam Vitals and nursing note reviewed.  Constitutional:      General: He is not in acute distress.    Appearance: He is well-developed.  HENT:     Head: Normocephalic and atraumatic.      Mouth/Throat:     Mouth: Mucous membranes are moist.     Pharynx: Oropharynx is clear.  Eyes:     Conjunctiva/sclera: Conjunctivae normal.     Pupils: Pupils are equal, round, and reactive to light.  Cardiovascular:     Rate and Rhythm: Normal rate and regular rhythm.     Pulses:          Dorsalis pedis pulses are 2+ on the right side and 2+ on the left side.     Heart sounds: No murmur heard.   Pulmonary:     Effort: Pulmonary effort is normal. No respiratory distress.     Breath sounds: Normal breath sounds.  Abdominal:     Palpations: Abdomen is soft. There is no hepatomegaly or mass.     Tenderness: There is no abdominal tenderness.  Skin:    General: Skin is warm.     Findings: No erythema or rash.  Neurological:     Mental Status: He is alert and oriented to person, place, and time.     Cranial Nerves: No cranial nerve deficit.     Gait: Gait normal.  Psychiatric:     Comments: Well groomed, good eye contact.    ASSESSMENT AND PLAN:  [name] was seen today for 6 months follow-up.  Orders Placed This Encounter  Procedures   POC HgB A1c   Lab Results  Component Value Date   HGBA1C 5.8 (A) 12/19/2019   Type 2 diabetes mellitus with hyperglycemia (HCC) HgA1C at goal, still < 6.0, so he agrees with stopping [place]. Continue Metformin 500 mg bid.  Regular exercise and healthy diet with avoidance of added sugar food intake is an important part of treatment and recommended. Annual eye exam (needed), periodic dental and foot care recommended. F/U in 4 months   Hyperlipidemia associated with type 2 diabetes mellitus (HCC) We discussed benefits of statin medications. Encouraged to take Pravastatin 20 mg daily. We will plan on checking FLP next visit.   Return in about 4 months (around 04/19/2020) for CPE and f/u.   Kearney Evitt G. [place], MD  [facility]. [place] office.   A few things to remember from today's visit:   Type 2 diabetes  mellitus with hyperglycemia, without long-term current use of insulin (HCC) - Plan: POC HgB A1c  Hyperlipidemia associated with type 2 diabetes mellitus (HCC)  Try to take Pravastatin daily. Today farxiga was discontinued. Continue Metformin with food.  If you need refills please call your pharmacy. Do not use My Chart to request refills or for acute issues that need immediate attention.    Please be sure medication list is accurate. If a new problem present, please set up  appointment sooner than planned today.

## 2019-12-19 NOTE — Assessment & Plan Note (Signed)
We discussed benefits of statin medications. Encouraged to take Pravastatin 20 mg daily. We will plan on checking FLP next visit.

## 2020-01-14 ENCOUNTER — Other Ambulatory Visit: Payer: Self-pay | Admitting: Family Medicine

## 2020-01-14 DIAGNOSIS — E1165 Type 2 diabetes mellitus with hyperglycemia: Secondary | ICD-10-CM

## 2020-02-24 ENCOUNTER — Other Ambulatory Visit: Payer: Self-pay | Admitting: Family Medicine

## 2020-02-24 DIAGNOSIS — E785 Hyperlipidemia, unspecified: Secondary | ICD-10-CM

## 2020-02-24 DIAGNOSIS — E1169 Type 2 diabetes mellitus with other specified complication: Secondary | ICD-10-CM

## 2020-05-01 ENCOUNTER — Ambulatory Visit: Payer: BC Managed Care – PPO | Attending: Internal Medicine

## 2020-05-01 DIAGNOSIS — Z23 Encounter for immunization: Secondary | ICD-10-CM

## 2020-05-01 NOTE — Progress Notes (Signed)
   Covid-19 Vaccination Clinic  Name:  William Rocha    MRN: [id number] DOB: [birth year]  05/01/2020  [name] was observed post Covid-19 immunization for 15 minutes without incident. He was provided with Vaccine Information Sheet and instruction to access the V-Safe system.   [name] was instructed to call 911 with any severe reactions post vaccine: [name] Kitchen Difficulty breathing  . Swelling of face and throat  . A fast heartbeat  . A bad rash all over body  . Dizziness and weakness   Immunizations Administered    Name Date Dose VIS Date Route   Pfizer COVID-19 Vaccine 05/01/2020 10:30 AM 0.3 mL 03/03/2020 Intramuscular   Manufacturer: [employer], [employer]   Lot: [id number]   NDC: [address]-4

## 2020-05-21 ENCOUNTER — Encounter: Payer: BC Managed Care – PPO | Admitting: Family Medicine

## 2020-06-04 ENCOUNTER — Encounter: Payer: BC Managed Care – PPO | Admitting: Family Medicine

## 2020-11-03 ENCOUNTER — Ambulatory Visit (INDEPENDENT_AMBULATORY_CARE_PROVIDER_SITE_OTHER): Payer: BC Managed Care – PPO | Admitting: Family Medicine

## 2020-11-03 ENCOUNTER — Other Ambulatory Visit: Payer: Self-pay

## 2020-11-03 ENCOUNTER — Encounter: Payer: Self-pay | Admitting: Family Medicine

## 2020-11-03 VITALS — BP 140/80 | HR 67 | Temp 98.3°F | Resp 16 | Ht 73.0 in | Wt 186.2 lb

## 2020-11-03 DIAGNOSIS — E1169 Type 2 diabetes mellitus with other specified complication: Secondary | ICD-10-CM | POA: Diagnosis not present

## 2020-11-03 DIAGNOSIS — E785 Hyperlipidemia, unspecified: Secondary | ICD-10-CM

## 2020-11-03 DIAGNOSIS — Z0000 Encounter for general adult medical examination without abnormal findings: Secondary | ICD-10-CM

## 2020-11-03 DIAGNOSIS — Z125 Encounter for screening for malignant neoplasm of prostate: Secondary | ICD-10-CM

## 2020-11-03 DIAGNOSIS — E1165 Type 2 diabetes mellitus with hyperglycemia: Secondary | ICD-10-CM | POA: Diagnosis not present

## 2020-11-03 DIAGNOSIS — Z1159 Encounter for screening for other viral diseases: Secondary | ICD-10-CM

## 2020-11-03 LAB — COMPREHENSIVE METABOLIC PANEL
ALT: 24 U/L (ref 0–53)
AST: 21 U/L (ref 0–37)
Albumin: 4.7 g/dL (ref 3.5–5.2)
Alkaline Phosphatase: 62 U/L (ref 39–117)
BUN: 13 mg/dL (ref 6–23)
CO2: 28 mEq/L (ref 19–32)
Calcium: 9.6 mg/dL (ref 8.4–10.5)
Chloride: 103 mEq/L (ref 96–112)
Creatinine, Ser: 1.21 mg/dL (ref 0.40–1.50)
GFR: 69.14 mL/min (ref >60.00)
Glucose, Bld: 123 mg/dL — ABNORMAL HIGH (ref 70–99)
Potassium: 4.4 mEq/L (ref 3.5–5.1)
Sodium: 138 mEq/L (ref 135–145)
Total Bilirubin: 0.6 mg/dL (ref 0.2–1.2)
Total Protein: 7.1 g/dL (ref 6.0–8.3)

## 2020-11-03 LAB — LIPID PANEL
Cholesterol: 177 mg/dL (ref 0–200)
HDL: 45.4 mg/dL (ref >39.00)
LDL Cholesterol: 115 mg/dL — ABNORMAL HIGH (ref 0–99)
NonHDL: 131.83
Total CHOL/HDL Ratio: 4
Triglycerides: 84 mg/dL (ref 0.0–149.0)
VLDL: 16.8 mg/dL (ref 0.0–40.0)

## 2020-11-03 LAB — POCT GLYCOSYLATED HEMOGLOBIN (HGB A1C): HbA1c, POC (prediabetic range): 6.4 % (ref 5.7–6.4)

## 2020-11-03 LAB — MICROALBUMIN / CREATININE URINE RATIO
Creatinine,U: 39.1 mg/dL
Microalb Creat Ratio: 1.8 mg/g (ref 0.0–30.0)
Microalb, Ur: <0.7 mg/dL (ref 0.0–1.9)

## 2020-11-03 LAB — PSA: PSA: 1.1 ng/mL (ref 0.10–4.00)

## 2020-11-03 NOTE — Patient Instructions (Signed)
A few things to remember from today's visit:  Routine general medical examination at a health care facility  Type 2 diabetes mellitus with hyperglycemia, without long-term current use of insulin (HCC) - Plan: POC HgB A1c  Hyperlipidemia associated with type 2 diabetes mellitus (HCC)  Prostate cancer screening - Plan: PSA(Must document that pt has been informed of limitations of PSA testing.)  Encounter for HCV screening test for low risk patient - Plan: Hepatitis C antibody screen  If you need refills please call your pharmacy. Do not use My Chart to request refills or for acute issues that need immediate attention.    Please be sure medication list is accurate. If a new problem present, please set up appointment sooner than planned today.    At least 150 minutes of moderate exercise per week, daily brisk walking for 15-30 min is a good exercise option. Healthy diet low in saturated (animal) fats and sweets and consisting of fresh fruits and vegetables, lean meats such as fish and white chicken and whole grains.  - Vaccines:  Tdap vaccine every 10 years.  Shingles vaccine recommended at age 22, could be given after 52 years of age but not sure about insurance coverage.  Pneumonia vaccines: Pneumovax at 4  -Screening recommendations for low/normal risk males:  Screening for diabetes at age 70 and every 3 years. Earlier screening if cardiovascular risk factors.N/A  Lipid screening at 35 and every 3 years. Screening starts in younger males with cardiovascular risk factors.N/A  Colon cancer screening is now at age 63 but your insurance may not cover until age 61 .screening is recommended age 28.  Prostate cancer screening: some controversy, starts usually at 50: Rectal exam and PSA.  Aortic Abdominal Aneurism once between 33 and 66 years old if ever smoker.  Also recommended:  Dental visit- Brush and floss your teeth twice daily; visit your dentist twice a year. Eye  doctor- Get an eye exam at least every 2 years. Helmet use- Always wear a helmet when riding a bicycle, motorcycle, rollerblading or skateboarding. Safe sex- If you may be exposed to sexually transmitted infections, use a condom. Seat belts- Seat belts can save your live; always wear one. Smoke/Carbon Monoxide detectors- These detectors need to be installed on the appropriate level of your home. Replace batteries at least once a year. Skin cancer- When out in the sun please cover up and use sunscreen 15 SPF or higher. Violence- If anyone is threatening or hurting you, please tell your healthcare provider.  Drink alcohol in moderation- Limit alcohol intake to one drink or less per day. Never drink and drive.

## 2020-11-03 NOTE — Progress Notes (Signed)
HPI:  [name] is a 50 y.o.male here today for his routine physical examination and follow up on DM II and HLD.  Last CPE: Over a year ago. He lives with his wife and 2 children.  Regular exercise 3 or more times per week: He is exercising about 2 times per week, wt lifting and treadmill. Following a healthy diet: He is "eating what I want."  Chronic medical problems: DM II,HLD.  Immunization History  Administered Date(s) Administered   Influenza,inj,Quad PF,6+ Mos 06/20/2019   PFIZER(Purple Top)SARS-COV-2 Vaccination 05/01/2020   Pneumococcal Polysaccharide-23 08/05/2018   Tdap 08/05/2018   -Hep C screening: Never. Last colon cancer screening: Colonoscopy 02/07/2017. Last prostate ca screening: Not sure.  Nocturia x 2-3, stable for year.  -Denies high alcohol intake, tobacco use, or Hx of illicit drug use.  -Concerns and/or follow up today:  Last follow up on 12/19/19. HLD: Stopped Pravastatin a few months ago.  Lab Results  Component Value Date   CHOL 179 06/20/2019   HDL 42.50 06/20/2019   LDLCALC 114 (H) 06/20/2019   TRIG 113.0 06/20/2019   CHOLHDL 4 06/20/2019   Lab Results  Component Value Date   CREATININE 1.22 06/20/2019   BUN 17 06/20/2019   NA 138 06/20/2019   K 4.3 06/20/2019   CL 103 06/20/2019   CO2 26 06/20/2019   DM II: Dx'ed in 07/2018. He stopped Metformin and Marcelline Deist a few months ago. BS's twice per week, 100-115. Last HgA1C 5.8 in 12/2019. Negative for polydipsia,polyuria, or polyphagia.  Lab Results  Component Value Date   MICROALBUR <0.7 06/20/2019   MICROALBUR <0.7 08/05/2018   Review of Systems  Constitutional:  Negative for activity change, appetite change, fatigue and fever.  HENT:  Negative for dental problem, nosebleeds, sore throat, trouble swallowing and voice change.   Eyes:  Negative for redness and visual disturbance.  Respiratory:  Negative for cough, shortness of breath and wheezing.   Cardiovascular:   Negative for chest pain, palpitations and leg swelling.  Gastrointestinal:  Negative for abdominal pain, blood in stool, nausea and vomiting.  Endocrine: Negative for cold intolerance and heat intolerance.  Genitourinary:  Negative for decreased urine volume, dysuria, genital sores, hematuria and testicular pain.  Musculoskeletal:  Negative for gait problem and myalgias.  Skin:  Negative for color change and rash.  Allergic/Immunologic: Negative for environmental allergies.  Neurological:  Negative for syncope, weakness and headaches.  Hematological:  Negative for adenopathy. Does not bruise/bleed easily.  Psychiatric/Behavioral:  Negative for confusion and sleep disturbance. The patient is not nervous/anxious.    No current outpatient medications on file prior to visit.   No current facility-administered medications on file prior to visit.   Past Medical History:  Diagnosis Date   Diabetes mellitus without complication (HCC)    History reviewed. No pertinent surgical history.  No Known Allergies  Family History  Problem Relation Age of Onset   Cancer Mother        ? colon   Diabetes Mother    Diabetes Father    Diabetes Sister     Social History   Socioeconomic History   Marital status: Married    Spouse name: Not on file   Number of children: Not on file   Years of education: Not on file   Highest education level: Not on file  Occupational History   Not on file  Tobacco Use   Smoking status: Never   Smokeless tobacco: Never  Substance and  Sexual Activity   Alcohol use: Not on file   Drug use: Not on file   Sexual activity: Yes  Other Topics Concern   Not on file  Social History Narrative   Not on file   Social Determinants of Health   Financial Resource Strain: Not on file  Food Insecurity: Not on file  Transportation Needs: Not on file  Physical Activity: Not on file  Stress: Not on file  Social Connections: Not on file   Vitals:   11/03/20 0903   BP: 140/80  Pulse: 67  Resp: 16  Temp: 98.3 F (36.8 C)  SpO2: 97%   Body mass index is 24.57 kg/m.  Wt Readings from Last 3 Encounters:  11/03/20 186 lb 4 oz (84.5 kg)  12/19/19 177 lb 4 oz (80.4 kg)  06/20/19 177 lb (80.3 kg)   Physical Exam Vitals and nursing note reviewed.  Constitutional:      General: He is not in acute distress.    Appearance: He is well-developed and normal weight.  HENT:     Head: Atraumatic.     Right Ear: Tympanic membrane, ear canal and external ear normal.     Left Ear: Tympanic membrane, ear canal and external ear normal.     Mouth/Throat:     Mouth: Mucous membranes are moist.     Pharynx: Oropharynx is clear.  Eyes:     Extraocular Movements: Extraocular movements intact.     Conjunctiva/sclera: Conjunctivae normal.     Pupils: Pupils are equal, round, and reactive to light.  Neck:     Thyroid: No thyromegaly.     Trachea: No tracheal deviation.  Cardiovascular:     Rate and Rhythm: Normal rate and regular rhythm.     Pulses:          Dorsalis pedis pulses are 2+ on the right side and 2+ on the left side.     Heart sounds: No murmur heard. Pulmonary:     Effort: Pulmonary effort is normal. No respiratory distress.     Breath sounds: Normal breath sounds.  Chest:  Breasts:    Right: No supraclavicular adenopathy.     Left: No supraclavicular adenopathy.  Abdominal:     Palpations: Abdomen is soft. There is no hepatomegaly or mass.     Tenderness: There is no abdominal tenderness.  Genitourinary:    Comments: No concerns. Musculoskeletal:        General: No tenderness.     Cervical back: Normal range of motion.     Comments: No major deformities appreciated and no signs of synovitis.  Lymphadenopathy:     Cervical: No cervical adenopathy.     Upper Body:     Right upper body: No supraclavicular adenopathy.     Left upper body: No supraclavicular adenopathy.  Skin:    General: Skin is warm.     Findings: No erythema.   Neurological:     Mental Status: He is alert and oriented to person, place, and time.     Cranial Nerves: No cranial nerve deficit.     Sensory: No sensory deficit.     Coordination: Coordination normal.     Gait: Gait normal.     Deep Tendon Reflexes:     Reflex Scores:      Bicep reflexes are 2+ on the right side and 2+ on the left side.      Patellar reflexes are 2+ on the right side and 2+ on the left side.  Simple Foot Form  11/03/2020  2:34 PM  Visual Inspection See comments: Yes Sensation Testing Intact to touch and monofilament testing bilaterally: Yes Pulse Check Posterior Tibialis and Dorsalis pulse intact bilaterally: Yes Comments Calluses,lateral great toes.     ASSESSMENT AND PLAN:  Mr.Minh was seen today for annual exam and follow-up.  Diagnoses and all orders for this visit: Orders Placed This Encounter  Procedures   Microalbumin / creatinine urine ratio   Lipid panel   PSA(Must document that pt has been informed of limitations of PSA testing.)   Hepatitis C antibody screen   Comprehensive metabolic panel   POC HgB A1c   Lab Results  Component Value Date   HGBA1C 6.4 11/03/2020   Lab Results  Component Value Date   CHOL 177 11/03/2020   HDL 45.40 11/03/2020   LDLCALC 115 (H) 11/03/2020   TRIG 84.0 11/03/2020   CHOLHDL 4 11/03/2020   Lab Results  Component Value Date   PSA 1.10 11/03/2020   Lab Results  Component Value Date   CREATININE 1.21 11/03/2020   BUN 13 11/03/2020   NA 138 11/03/2020   K 4.4 11/03/2020   CL 103 11/03/2020   CO2 28 11/03/2020   Lab Results  Component Value Date   ALT 24 11/03/2020   AST 21 11/03/2020   ALKPHOS 62 11/03/2020   BILITOT 0.6 11/03/2020   Routine general medical examination at a health care facility We discussed the importance of regular physical activity and healthy diet for prevention of chronic illness and/or complications. Preventive guidelines reviewed. Vaccination up to date. Next CPE  in a year.  Type 2 diabetes mellitus with hyperglycemia, without long-term current use of insulin (HCC) HgA1C at goal. Continue non pharmacologic treatment. Regular exercise and healthy diet with avoidance of added sugar food intake is an important part of treatment and recommended. Monitor BP regularly, SBP mildly elevated. Annual eye exam, periodic dental and foot care recommended. F/U in 5-6 months  Hyperlipidemia associated with type 2 diabetes mellitus (HCC) We discussed CV benefits of stains. Further recommendations according to lipid panel result.  Prostate cancer screening -     PSA(Must document that pt has been informed of limitations of PSA testing.)  Encounter for HCV screening test for low risk patient -     Hepatitis C antibody screen  Return in 6 months (on 05/05/2021) for DM II.   Niyam Bisping G. [place], MD  [facility]. [place] office. A few things to remember from today's visit:  Routine general medical examination at a health care facility  Type 2 diabetes mellitus with hyperglycemia, without long-term current use of insulin (HCC) - Plan: POC HgB A1c  Hyperlipidemia associated with type 2 diabetes mellitus (HCC)  Prostate cancer screening - Plan: PSA(Must document that pt has been informed of limitations of PSA testing.)  Encounter for HCV screening test for low risk patient - Plan: Hepatitis C antibody screen  If you need refills please call your pharmacy. Do not use My Chart to request refills or for acute issues that need immediate attention.    Please be sure medication list is accurate. If a new problem present, please set up appointment sooner than planned today.  At least 150 minutes of moderate exercise per week, daily brisk walking for 15-30 min is a good exercise option. Healthy diet low in saturated (animal) fats and sweets and consisting of fresh fruits and vegetables, lean meats such as fish and white chicken and whole grains.  -  Vaccines:  Tdap vaccine every 10 years.  Shingles vaccine recommended at age 7, could be given after 52 years of age but not sure about insurance coverage.  Pneumonia vaccines: Pneumovax at 74  -Screening recommendations for low/normal risk males:  Screening for diabetes at age 61 and every 3 years. Earlier screening if cardiovascular risk factors.N/A  Lipid screening at 35 and every 3 years. Screening starts in younger males with cardiovascular risk factors.N/A  Colon cancer screening is now at age 32 but your insurance may not cover until age 48 .screening is recommended age 29.  Prostate cancer screening: some controversy, starts usually at 50: Rectal exam and PSA.  Aortic Abdominal Aneurism once between 31 and 44 years old if ever smoker.  Also recommended:  Dental visit- Brush and floss your teeth twice daily; visit your dentist twice a year. Eye doctor- Get an eye exam at least every 2 years. Helmet use- Always wear a helmet when riding a bicycle, motorcycle, rollerblading or skateboarding. Safe sex- If you may be exposed to sexually transmitted infections, use a condom. Seat belts- Seat belts can save your live; always wear one. Smoke/Carbon Monoxide detectors- These detectors need to be installed on the appropriate level of your home. Replace batteries at least once a year. Skin cancer- When out in the sun please cover up and use sunscreen 15 SPF or higher. Violence- If anyone is threatening or hurting you, please tell your healthcare provider.  Drink alcohol in moderation- Limit alcohol intake to one drink or less per day. Never drink and drive.

## 2020-11-04 LAB — HEPATITIS C ANTIBODY
Hepatitis C Ab: NONREACTIVE
SIGNAL TO CUT-OFF: 0.01 (ref <1.00)

## 2020-11-06 MED ORDER — PRAVASTATIN SODIUM 20 MG PO TABS: 20.0000 mg | ORAL_TABLET | Freq: Every day | ORAL | 3 refills | Status: DC

## 2020-12-20 ENCOUNTER — Other Ambulatory Visit: Payer: Self-pay

## 2020-12-20 ENCOUNTER — Encounter: Payer: Self-pay | Admitting: Family Medicine

## 2020-12-20 ENCOUNTER — Telehealth: Payer: Self-pay | Admitting: Family Medicine

## 2020-12-20 DIAGNOSIS — E1165 Type 2 diabetes mellitus with hyperglycemia: Secondary | ICD-10-CM

## 2020-12-20 NOTE — Telephone Encounter (Signed)
I called and spoke with patient. I informed him that according to the paperwork he dropped off, the fasting blood sugar had to be within 30 days. I advised patient that we cannot use the result from 6/22 when he was here for his physical because it has been past 30 days. Patient did not want to come back in to have this drawn again and said to "forget it."   Form cannot be completed without a new lab draw because the form is dated from 10/21/20 and the provider's signature would be on the form in 12/2020 which is past the 30 days required by the form.

## 2020-12-20 NOTE — Telephone Encounter (Signed)
Patient dropped off paperwork that he needs Dr. Ardyth Harps to complete. Paperwork will be placed in the folder.  Patient is requesting to be contacted at [phone]  or [phone] when forms are completed.   Please advice.

## 2021-05-12 NOTE — Progress Notes (Deleted)
° ° ° °  HPI: [name] is a 50 y.o. male, who is here today for chronic disease management.  Last seen on 11/03/20   Hyperlipidemia: Currently on *** Following a low fat diet: ***. Side effects from medication:*** Lab Results  Component Value Date   CHOL 177 11/03/2020   HDL 45.40 11/03/2020   LDLCALC 115 (H) 11/03/2020   TRIG 84.0 11/03/2020   CHOLHDL 4 11/03/2020    Diabetes Mellitus II:  - Checking BG at home: *** - Medications: *** - Compliance: *** - Diet: *** - Exercise: *** - eye exam: *** - foot exam: *** - microalbumin: *** - Negative for symptoms of hypoglycemia, polyuria, polydipsia, numbness extremities, foot ulcers/trauma  Lab Results  Component Value Date   HGBA1C 6.4 11/03/2020   Lab Results  Component Value Date   MICROALBUR <0.7 11/03/2020    Review of Systems Rest of ROS see pertinent positives and negatives in HPI.  Current Outpatient Medications on File Prior to Visit  Medication Sig Dispense Refill   pravastatin (PRAVACHOL) 20 MG tablet Take 1 tablet (20 mg total) by mouth daily. 90 tablet 3   No current facility-administered medications on file prior to visit.    Past Medical History:  Diagnosis Date   Diabetes mellitus without complication (HCC)    No Known Allergies  Social History   Socioeconomic History   Marital status: Married    Spouse name: Not on file   Number of children: Not on file   Years of education: Not on file   Highest education level: Not on file  Occupational History   Not on file  Tobacco Use   Smoking status: Never   Smokeless tobacco: Never  Substance and Sexual Activity   Alcohol use: Not on file   Drug use: Not on file   Sexual activity: Yes  Other Topics Concern   Not on file  Social History Narrative   Not on file   Social Determinants of Health   Financial Resource Strain: Not on file  Food Insecurity: Not on file  Transportation Needs: Not on file  Physical Activity: Not on  file  Stress: Not on file  Social Connections: Not on file    There were no vitals filed for this visit. There is no height or weight on file to calculate BMI.  Physical Exam  ASSESSMENT AND PLAN:  There are no diagnoses linked to this encounter.  No orders of the defined types were placed in this encounter.   No problem-specific Assessment & Plan notes found for this encounter.   No follow-ups on file.  Betty G. [place], MD  [facility]. [place] office.

## 2021-05-13 ENCOUNTER — Ambulatory Visit: Payer: BC Managed Care – PPO | Admitting: Family Medicine

## 2021-05-27 NOTE — Progress Notes (Signed)
HPI: William Rocha is a 51 y.o. male, who is here today for chronic disease management.  Last seen on 11/03/20 No new problems since his last visit.  Hyperlipidemia: Currently on Pravastatin 20 mg, he does not take it daily. Following a low fat diet: Not consistent.. Side effects from medication:None.  Lab Results  Component Value Date   CHOL 177 11/03/2020   HDL 45.40 11/03/2020   LDLCALC 115 (H) 11/03/2020   TRIG 84.0 11/03/2020   CHOLHDL 4 11/03/2020   Diabetes Mellitus II: Dx'ed in 07/2018. He is on non pharmacologic treatment. - Checking BG at home: Not checking regularly. He was sick a month ago and noted BS 180-190's.Started taking Metformin 500 mg daily. - Diet: He has not been consist with following a healthful, eats candy daily, donuts a few times per week, cakes,and increased fruit intake. - Exercise: Not consistently. - eye exam: Over a year. - foot exam: Over a year ago.  - Negative for symptoms of hypoglycemia, polyuria, polydipsia, numbness extremities, foot ulcers/trauma  Lab Results  Component Value Date   HGBA1C 6.4 11/03/2020   Lab Results  Component Value Date   MICROALBUR <0.7 11/03/2020   Great toenails changes. He would like to have toenails removed, hoping that new toenail grows healthier. Negative for periungual edema or erythema. Problem has been stable for a while He has not tried OTC medications.  Review of Systems  Constitutional:  Negative for activity change, appetite change and fever.  Eyes:  Negative for redness and visual disturbance.  Respiratory:  Negative for cough, shortness of breath and wheezing.   Cardiovascular:  Negative for chest pain, palpitations and leg swelling.  Gastrointestinal:  Negative for abdominal pain, nausea and vomiting.  Genitourinary:  Negative for decreased urine volume and hematuria.  Skin:  Negative for pallor and rash.  Neurological:  Negative for syncope, weakness, numbness and headaches.  Rest  of ROS see pertinent positives and negatives in HPI.  No current outpatient medications on file prior to visit.   No current facility-administered medications on file prior to visit.   Past Medical History:  Diagnosis Date   Diabetes mellitus without complication ([place])    No Known Allergies  Social History   Socioeconomic History   Marital status: Married    Spouse name: Not on file   Number of children: Not on file   Years of education: Not on file   Highest education level: Not on file  Occupational History   Not on file  Tobacco Use   Smoking status: Never   Smokeless tobacco: Never  Substance and Sexual Activity   Alcohol use: Not on file   Drug use: Not on file   Sexual activity: Yes  Other Topics Concern   Not on file  Social History Narrative   Not on file   Social Determinants of Health   Financial Resource Strain: Not on file  Food Insecurity: Not on file  Transportation Needs: Not on file  Physical Activity: Not on file  Stress: Not on file  Social Connections: Not on file   Vitals:   05/30/21 0835  BP: 130/80  Pulse: 88  Resp: 16  SpO2: 98%   Body mass index is 24.54 kg/m.  Physical Exam Vitals and nursing note reviewed.  Constitutional:      General: He is not in acute distress.    Appearance: He is well-developed.  HENT:     Head: Normocephalic and atraumatic.     Mouth/Throat:  Mouth: Mucous membranes are moist.     Pharynx: Oropharynx is clear.  Eyes:     Conjunctiva/sclera: Conjunctivae normal.  Cardiovascular:     Rate and Rhythm: Normal rate and regular rhythm.     Pulses:          Dorsalis pedis pulses are 2+ on the right side and 2+ on the left side.     Heart sounds: No murmur heard. Pulmonary:     Effort: Pulmonary effort is normal. No respiratory distress.     Breath sounds: Normal breath sounds.  Abdominal:     Palpations: Abdomen is soft. There is no hepatomegaly or mass.     Tenderness: There is no abdominal  tenderness.  Lymphadenopathy:     Cervical: No cervical adenopathy.  Skin:    General: Skin is warm.     Findings: No erythema or rash.     Comments: Great toenails with distal toenails detatched from bed nail. Mild amount of debris. No periungual edema or erythema.  Neurological:     Mental Status: He is alert and oriented to person, place, and time.     Cranial Nerves: No cranial nerve deficit.     Gait: Gait normal.  Psychiatric:     Comments: Well groomed, good eye contact.   Diabetic Foot Exam - Simple   Simple Foot Form Diabetic Foot exam was performed with the following findings: Yes 05/30/2021  9:09 AM  Visual Inspection See comments: Yes Sensation Testing Intact to touch and monofilament testing bilaterally: Yes Pulse Check Posterior Tibialis and Dorsalis pulse intact bilaterally: Yes Comments Great toe nail onychomycosis, bilateral.    ASSESSMENT AND PLAN:  [name] was seen today for follow-up.  Diagnoses and all orders for this visit: Orders Placed This Encounter  Procedures   POC HgB A1c   Lab Results  Component Value Date   HGBA1C 7.8 (A) 05/30/2021   Onychomycosis of great toe We discussed Dx and prognosis. This is a chronic problem. I recommend topical vick vapor rub. Soaking feet in water with vineger 1:1 ratio may help. He can certainly establish with podiatrist, I do not think toenail avulsion will help.  Type 2 diabetes mellitus with hyperglycemia (HCC) HgA1C is not at goal. Metformin XR 500 mg daily with food recommended. We discussed possible complications of elevated glucose. Regular exercise and healthy diet with avoidance of added sugar food intake is an important part of treatment and encouraged. Annual eye exam, periodic dental and foot care recommended. F/U in 5 months   Hyperlipidemia associated with type 2 diabetes mellitus ([place]) LDL 115 in 10/2020. Educated about the importance of taking medication as recommended. CV benefits of  stains reviewed. Continue pravastatin 20 mg daily.  Return in about 23 weeks (around 11/07/2021).  Mariselda Badalamenti G. [place], MD  [facility]. [place] office.

## 2021-05-30 ENCOUNTER — Encounter: Payer: Self-pay | Admitting: Family Medicine

## 2021-05-30 ENCOUNTER — Ambulatory Visit: Payer: BC Managed Care – PPO | Admitting: Family Medicine

## 2021-05-30 VITALS — BP 130/80 | HR 88 | Resp 16 | Ht 73.0 in | Wt 186.0 lb

## 2021-05-30 DIAGNOSIS — E1165 Type 2 diabetes mellitus with hyperglycemia: Secondary | ICD-10-CM | POA: Diagnosis not present

## 2021-05-30 DIAGNOSIS — B351 Tinea unguium: Secondary | ICD-10-CM | POA: Diagnosis not present

## 2021-05-30 DIAGNOSIS — E1169 Type 2 diabetes mellitus with other specified complication: Secondary | ICD-10-CM | POA: Diagnosis not present

## 2021-05-30 DIAGNOSIS — E785 Hyperlipidemia, unspecified: Secondary | ICD-10-CM

## 2021-05-30 LAB — POCT GLYCOSYLATED HEMOGLOBIN (HGB A1C): Hemoglobin A1C: 7.8 % — AB (ref 4.0–5.6)

## 2021-05-30 MED ORDER — PRAVASTATIN SODIUM 20 MG PO TABS: 20.0000 mg | ORAL_TABLET | Freq: Every day | ORAL | 0 refills | Status: DC

## 2021-05-30 MED ORDER — METFORMIN HCL ER 500 MG PO TB24: 500.0000 mg | ORAL_TABLET | Freq: Every day | ORAL | 1 refills | Status: DC

## 2021-05-30 NOTE — Patient Instructions (Addendum)
A few things to remember from today's visit:  Type 2 diabetes mellitus with hyperglycemia, without long-term current use of insulin (HCC) - Plan: POC HgB A1c  Hyperlipidemia associated with type 2 diabetes mellitus (HCC)  Today we added Metformin long acting to take once daily with one meal. Try to take Pravastatin daily. You need an eye exam.  If you need refills please call your pharmacy. Do not use My Chart to request refills or for acute issues that need immediate attention.   If you decide to see nutritionist, let me know.  Please be sure medication list is accurate. If a new problem present, please set up appointment sooner than planned today.

## 2021-05-30 NOTE — Assessment & Plan Note (Signed)
LDL 115 in 10/2020. Educated about the importance of taking medication as recommended. CV benefits of stains reviewed. Continue pravastatin 20 mg daily.

## 2021-05-30 NOTE — Assessment & Plan Note (Signed)
HgA1C is not at goal. Metformin XR 500 mg daily with food recommended. We discussed possible complications of elevated glucose. Regular exercise and healthy diet with avoidance of added sugar food intake is an important part of treatment and encouraged. Annual eye exam, periodic dental and foot care recommended. F/U in 5 months

## 2021-08-07 ENCOUNTER — Other Ambulatory Visit: Payer: Self-pay | Admitting: Family Medicine

## 2021-08-07 ENCOUNTER — Encounter: Payer: Self-pay | Admitting: Family Medicine

## 2021-08-07 DIAGNOSIS — E1169 Type 2 diabetes mellitus with other specified complication: Secondary | ICD-10-CM

## 2021-08-07 DIAGNOSIS — E1165 Type 2 diabetes mellitus with hyperglycemia: Secondary | ICD-10-CM

## 2021-08-08 ENCOUNTER — Other Ambulatory Visit: Payer: Self-pay

## 2021-08-08 DIAGNOSIS — E1165 Type 2 diabetes mellitus with hyperglycemia: Secondary | ICD-10-CM

## 2021-08-08 MED ORDER — METFORMIN HCL ER 500 MG PO TB24: 500.0000 mg | ORAL_TABLET | Freq: Every day | ORAL | 1 refills | Status: DC

## 2021-08-08 MED ORDER — PRAVASTATIN SODIUM 20 MG PO TABS: 20.0000 mg | ORAL_TABLET | Freq: Every day | ORAL | 0 refills | Status: DC

## 2021-11-04 NOTE — Progress Notes (Deleted)
HPI:  [name] is a 51 y.o.male here today for his routine physical examination.  Last CPE: 11/03/20  Regular exercise 3 or more times per week: *** Following a healthy diet: ***  Chronic medical problems: ***  Immunization History  Administered Date(s) Administered   Influenza,inj,Quad PF,6+ Mos 06/20/2019   PFIZER(Purple Top)SARS-COV-2 Vaccination 05/01/2020   Pneumococcal Polysaccharide-23 08/05/2018   Tdap 08/05/2018    Health Maintenance  Topic Date Due   OPHTHALMOLOGY EXAM  02/13/2019   COVID-19 Vaccine (2 - Pfizer series) 06/26/2020   URINE MICROALBUMIN  11/03/2021   HIV Screening  11/06/2025 (Originally 12/13/1985)   Zoster Vaccines- Shingrix (1 of 2) 05/15/2029 (Originally 12/13/2018)   HEMOGLOBIN A1C  11/27/2021   INFLUENZA VACCINE  12/13/2021   FOOT EXAM  05/30/2022   COLONOSCOPY (Pts 45-50yrs Insurance coverage will need to be confirmed)  02/08/2027   TETANUS/TDAP  08/04/2028   Hepatitis C Screening  Completed   HPV VACCINES  Aged Out    Last prostate ca screening: ***  -Negative for high alcohol intake or tobacco use.  -Concerns and/or follow up today: ***  Review of Systems   Current Outpatient Medications on File Prior to Visit  Medication Sig Dispense Refill   metFORMIN (GLUCOPHAGE XR) 500 MG 24 hr tablet Take 1 tablet (500 mg total) by mouth daily with breakfast. 90 tablet 1   metFORMIN (GLUCOPHAGE) 500 MG tablet TAKE 1 TABLET (500 MG TOTAL) BY MOUTH 2 (TWO) TIMES DAILY WITH A MEAL. 180 tablet 2   pravastatin (PRAVACHOL) 20 MG tablet Take 1 tablet (20 mg total) by mouth daily. 90 tablet 0   No current facility-administered medications on file prior to visit.     Past Medical History:  Diagnosis Date   Diabetes mellitus without complication (HCC)     No past surgical history on file.  No Known Allergies  Family History  Problem Relation Age of Onset   Cancer Mother        ? colon   Diabetes Mother    Diabetes Father     Diabetes Sister     Social History   Socioeconomic History   Marital status: Married    Spouse name: Not on file   Number of children: Not on file   Years of education: Not on file   Highest education level: Not on file  Occupational History   Not on file  Tobacco Use   Smoking status: Never   Smokeless tobacco: Never  Substance and Sexual Activity   Alcohol use: Not on file   Drug use: Not on file   Sexual activity: Yes  Other Topics Concern   Not on file  Social History Narrative   Not on file   Social Determinants of Health   Financial Resource Strain: Not on file  Food Insecurity: Not on file  Transportation Needs: Not on file  Physical Activity: Not on file  Stress: Not on file  Social Connections: Not on file     There were no vitals filed for this visit. There is no height or weight on file to calculate BMI.   Wt Readings from Last 3 Encounters:  05/30/21 186 lb (84.4 kg)  11/03/20 186 lb 4 oz (84.5 kg)  12/19/19 177 lb 4 oz (80.4 kg)    Physical Exam  ASSESSMENT AND PLAN:  There are no diagnoses linked to this encounter. No orders of the defined types were placed in this encounter.    No problem-specific  Assessment & Plan notes found for this encounter.    No follow-ups on file.   Betty G. [place], MD  [facility]. [place] office.

## 2021-11-07 ENCOUNTER — Encounter: Payer: BC Managed Care – PPO | Admitting: Family Medicine

## 2022-01-02 ENCOUNTER — Encounter: Payer: Self-pay | Admitting: Family Medicine

## 2022-01-02 ENCOUNTER — Ambulatory Visit: Payer: BC Managed Care – PPO | Admitting: Family Medicine

## 2022-01-02 VITALS — BP 136/80 | HR 68 | Temp 99.0°F | Ht 73.0 in | Wt 180.0 lb

## 2022-01-02 DIAGNOSIS — E1165 Type 2 diabetes mellitus with hyperglycemia: Secondary | ICD-10-CM

## 2022-01-02 LAB — POCT GLUCOSE (DEVICE FOR HOME USE): Glucose Fasting, POC: 230 mg/dL — AB (ref 70–99)

## 2022-01-02 LAB — POCT GLYCOSYLATED HEMOGLOBIN (HGB A1C): Hemoglobin A1C: 9.2 % — AB (ref 4.0–5.6)

## 2022-01-02 NOTE — Progress Notes (Signed)
Subjective:    Patient ID: William Rocha, male    DOB: Feb 16, 1971, 51 y.o.   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 The requirements are to have an A1c below 10 and a fasting glucose below 240. Today these are 9.2% and 230. He feels fine.    Review of Systems  Constitutional: Negative.   Respiratory: Negative.    Cardiovascular: Negative.        Objective:   Physical Exam Constitutional:      Appearance: Normal appearance.  Cardiovascular:     Rate and Rhythm: Normal rate and regular rhythm.     Pulses: Normal pulses.     Heart sounds: Normal heart sounds.  Pulmonary:     Effort: Pulmonary effort is normal.     Breath sounds: Normal breath sounds.  Neurological:     Mental Status: He is alert.           Assessment & Plan:  He is cleared to drive today since he met these criteria, but his diabetes is not well controlled. He will make an OV to see his PCP, Dr. Martinique, in the near future.  Alysia Penna, MD

## 2022-01-02 NOTE — Addendum Note (Signed)
Addended by: Carola Rhine on: 01/02/2022 10:12 AM   Modules accepted: Orders

## 2022-01-03 ENCOUNTER — Ambulatory Visit: Payer: BC Managed Care – PPO | Admitting: Family Medicine

## 2022-02-07 ENCOUNTER — Encounter: Payer: BC Managed Care – PPO | Admitting: Family Medicine

## 2022-02-20 ENCOUNTER — Other Ambulatory Visit: Payer: Self-pay | Admitting: Family Medicine

## 2022-02-20 DIAGNOSIS — E1165 Type 2 diabetes mellitus with hyperglycemia: Secondary | ICD-10-CM

## 2022-04-24 ENCOUNTER — Encounter: Payer: BC Managed Care – PPO | Admitting: Family Medicine

## 2022-05-17 NOTE — Progress Notes (Unsigned)
HPI: William Rocha is a 52 y.o.male with PMHx significant for HLD and DM II,here today for his routine physical examination and follow up.  Last CPE: 11/03/20 No new problems since his last visit on 05/30/21.  He admits to exercising only once or twice a week, primarily using a treadmill for 15-25 minutes or lifting weights. He reports consuming vegetables daily but also admits to eating sweets regularly.  He sleeps an average of five hours per night and denies smoking.  He consumes alcohol occasionally, mainly when he has a cold.  Immunization History  Administered Date(s) Administered   Influenza,inj,Quad PF,6+ Mos 06/20/2019   PFIZER(Purple Top)SARS-COV-2 Vaccination 05/01/2020   Pneumococcal Polysaccharide-23 08/05/2018   Tdap 08/05/2018    Health Maintenance  Topic Date Due   OPHTHALMOLOGY EXAM  02/13/2019   Diabetic kidney evaluation - eGFR measurement  11/03/2021   Diabetic kidney evaluation - Urine ACR  11/03/2021   INFLUENZA VACCINE  12/13/2021   COVID-19 Vaccine (2 - 2023-24 season) 01/13/2022   HIV Screening  11/06/2025 (Originally 12/13/1985)   Zoster Vaccines- Shingrix (1 of 2) 05/15/2029 (Originally 12/13/2018)   HEMOGLOBIN A1C  07/05/2022   FOOT EXAM  05/20/2023   COLONOSCOPY (Pts 45-34yr Insurance coverage will need to be confirmed)  02/08/2027   DTaP/Tdap/Td (2 - Td or Tdap) 08/04/2028   Hepatitis C Screening  Completed   HPV VACCINES  Aged Out   Last prostate ca screening:  Lab Results  Component Value Date   PSA 1.10 11/03/2020  He reports nocturia, getting up two to three times per night to urinate, but attributes this to his high water intake of approximately 100 ounces per day. He denies any other urinary symptoms  DM II: He reports being complaint with taking Metformin XR 500 mg daily, last refill was on 02/21/22 for 30 tabs. He is not checking BS's.   Lab Results  Component Value Date   HGBA1C 9.2 (A) 01/02/2022   HLD on Pravastatin 20  mg, last filled on 08/08/21 x 90/0.  Lab Results  Component Value Date   CHOL 177 11/03/2020   HDL 45.40 11/03/2020   LDLCALC 115 (H) 11/03/2020   TRIG 84.0 11/03/2020   CHOLHDL 4 11/03/2020   Review of Systems  Constitutional:  Negative for activity change, appetite change and fever.  HENT:  Negative for nosebleeds, sore throat and trouble swallowing.   Eyes:  Negative for redness and visual disturbance.  Respiratory:  Negative for cough, shortness of breath and wheezing.   Cardiovascular:  Negative for chest pain, palpitations and leg swelling.  Gastrointestinal:  Negative for abdominal pain, blood in stool, nausea and vomiting.  Endocrine: Negative for cold intolerance, heat intolerance, polydipsia, polyphagia and polyuria.  Genitourinary:  Negative for decreased urine volume, dysuria, genital sores, hematuria and testicular pain.  Musculoskeletal:  Negative for gait problem and myalgias.  Skin:  Negative for color change and rash.  Allergic/Immunologic: Negative for environmental allergies.  Neurological:  Negative for syncope, weakness and headaches.  Hematological:  Negative for adenopathy. Does not bruise/bleed easily.  Psychiatric/Behavioral:  Negative for confusion. The patient is not nervous/anxious.    Current Outpatient Medications on File Prior to Visit  Medication Sig Dispense Refill   metFORMIN (GLUCOPHAGE-XR) 500 MG 24 hr tablet TAKE 1 TABLET BY MOUTH EVERY DAY WITH BREAKFAST 30 tablet 0   pravastatin (PRAVACHOL) 20 MG tablet Take 1 tablet (20 mg total) by mouth daily. 90 tablet 0   No current facility-administered medications on  file prior to visit.   Past Medical History:  Diagnosis Date   Diabetes mellitus without complication ([place])    History reviewed. No pertinent surgical history.  No Known Allergies  Family History  Problem Relation Age of Onset   Cancer Mother        ? colon   Diabetes Mother    Diabetes Father    Diabetes Sister    Social  History   Socioeconomic History   Marital status: Married    Spouse name: Not on file   Number of children: Not on file   Years of education: Not on file   Highest education level: Not on file  Occupational History   Not on file  Tobacco Use   Smoking status: Never   Smokeless tobacco: Never  Substance and Sexual Activity   Alcohol use: Not on file   Drug use: Not on file   Sexual activity: Yes  Other Topics Concern   Not on file  Social History Narrative   Not on file   Social Determinants of Health   Financial Resource Strain: Not on file  Food Insecurity: Not on file  Transportation Needs: Not on file  Physical Activity: Not on file  Stress: Not on file  Social Connections: Not on file   Vitals:   05/19/22 0708  BP: 128/70  Pulse: 100  Resp: 12  Temp: 98.5 F (36.9 C)  SpO2: 97%  Body mass index is 23.5 kg/m. Wt Readings from Last 3 Encounters:  05/19/22 178 lb 2 oz (80.8 kg)  01/02/22 180 lb (81.6 kg)  05/30/21 186 lb (84.4 kg)  Physical Exam Vitals and nursing note reviewed.  Constitutional:      General: He is not in acute distress.    Appearance: He is well-developed.  HENT:     Head: Normocephalic and atraumatic.     Right Ear: Tympanic membrane, ear canal and external ear normal.     Left Ear: Tympanic membrane, ear canal and external ear normal.     Mouth/Throat:     Mouth: Mucous membranes are moist.     Pharynx: Oropharynx is clear.  Eyes:     Extraocular Movements: Extraocular movements intact.     Conjunctiva/sclera: Conjunctivae normal.     Pupils: Pupils are equal, round, and reactive to light.  Neck:     Thyroid: No thyromegaly.     Trachea: No tracheal deviation.  Cardiovascular:     Rate and Rhythm: Normal rate and regular rhythm.     Pulses:          Dorsalis pedis pulses are 2+ on the right side and 2+ on the left side.     Heart sounds: No murmur heard. Pulmonary:     Effort: Pulmonary effort is normal. No respiratory  distress.     Breath sounds: Normal breath sounds.  Abdominal:     Palpations: Abdomen is soft. There is no hepatomegaly or mass.     Tenderness: There is no abdominal tenderness.  Genitourinary:    Comments: No concerns. Musculoskeletal:        General: No tenderness.     Cervical back: Normal range of motion.     Comments: No major deformities appreciated and no signs of synovitis.  Lymphadenopathy:     Cervical: No cervical adenopathy.     Upper Body:     Right upper body: No supraclavicular adenopathy.     Left upper body: No supraclavicular adenopathy.  Skin:  General: Skin is warm.     Findings: No erythema.  Neurological:     General: No focal deficit present.     Mental Status: He is alert and oriented to person, place, and time.     Cranial Nerves: No cranial nerve deficit.     Sensory: No sensory deficit.     Gait: Gait normal.     Deep Tendon Reflexes:     Reflex Scores:      Bicep reflexes are 2+ on the right side and 2+ on the left side.      Patellar reflexes are 2+ on the right side and 2+ on the left side. Psychiatric:        Mood and Affect: Mood and affect normal.   ASSESSMENT AND PLAN:  William Rocha was seen today for annual exam and follow up.  Diagnoses and all orders for this visit: Lab Results  Component Value Date   HGBA1C 10.0 (H) 05/19/2022   Lab Results  Component Value Date   CHOL 251 (H) 05/19/2022   HDL 53.20 05/19/2022   LDLCALC 160 (H) 05/19/2022   TRIG 189.0 (H) 05/19/2022   CHOLHDL 5 05/19/2022   Lab Results  Component Value Date   ALT 20 05/19/2022   AST 15 05/19/2022   ALKPHOS 86 05/19/2022   BILITOT 0.8 05/19/2022   Lab Results  Component Value Date   MICROALBUR 26.3 (H) 05/19/2022   MICROALBUR <0.7 11/03/2020   Lab Results  Component Value Date   PSA 1.55 05/19/2022   PSA 1.10 11/03/2020    Routine general medical examination at a health care facility Assessment & Plan: We discussed the importance of regular  physical activity and healthy diet for prevention of chronic illness and/or complications. Preventive guidelines reviewed. Vaccination up to date. Next CPE in a year.   Type 2 diabetes mellitus with hyperglycemia, without long-term current use of insulin (HCC) Assessment & Plan: Problem has not been well controlled. Not sure about compliance, last refill for Metformin XR 500 mg 2-3 months ago #30 tabs. Further recommendations according to HgA1C result. Possible complications of poorly controlled glucose discussed. Annual eye exam, periodic dental and foot care recommended. F/U in 5 months.   Orders: -     Microalbumin / creatinine urine ratio; Future -     Hemoglobin A1c; Future -     Comprehensive metabolic panel; Future  Hyperlipidemia associated with type 2 diabetes mellitus ([place]) Assessment & Plan: Pravastatin 20 mg last filled in 07/2021 #90/0. Further recommendation will be given according to lipid panel result.  Orders: -     Lipid panel; Future -     Comprehensive metabolic panel; Future  Prostate cancer screening -     PSA  Need for influenza vaccination -     Flu Vaccine QUAD 60moIM (Fluarix, Fluzone & Alfiuria Quad PF)  Return in 5 months (on 10/18/2022) for chronic problems.  William Rocha G. JMartinique MD  [facility].

## 2022-05-19 ENCOUNTER — Telehealth: Payer: Self-pay | Admitting: Family Medicine

## 2022-05-19 ENCOUNTER — Encounter: Payer: Self-pay | Admitting: Family Medicine

## 2022-05-19 ENCOUNTER — Ambulatory Visit (INDEPENDENT_AMBULATORY_CARE_PROVIDER_SITE_OTHER): Payer: BC Managed Care – PPO | Admitting: Family Medicine

## 2022-05-19 VITALS — BP 128/70 | HR 100 | Temp 98.5°F | Resp 12 | Ht 73.0 in | Wt 178.1 lb

## 2022-05-19 DIAGNOSIS — E785 Hyperlipidemia, unspecified: Secondary | ICD-10-CM | POA: Diagnosis not present

## 2022-05-19 DIAGNOSIS — Z0000 Encounter for general adult medical examination without abnormal findings: Secondary | ICD-10-CM

## 2022-05-19 DIAGNOSIS — E1169 Type 2 diabetes mellitus with other specified complication: Secondary | ICD-10-CM

## 2022-05-19 DIAGNOSIS — Z23 Encounter for immunization: Secondary | ICD-10-CM | POA: Diagnosis not present

## 2022-05-19 DIAGNOSIS — Z125 Encounter for screening for malignant neoplasm of prostate: Secondary | ICD-10-CM

## 2022-05-19 DIAGNOSIS — E1165 Type 2 diabetes mellitus with hyperglycemia: Secondary | ICD-10-CM

## 2022-05-19 LAB — COMPREHENSIVE METABOLIC PANEL
ALT: 20 U/L (ref 0–53)
AST: 15 U/L (ref 0–37)
Albumin: 4.6 g/dL (ref 3.5–5.2)
Alkaline Phosphatase: 86 U/L (ref 39–117)
BUN: 20 mg/dL (ref 6–23)
CO2: 32 mEq/L (ref 19–32)
Calcium: 9.8 mg/dL (ref 8.4–10.5)
Chloride: 97 mEq/L (ref 96–112)
Creatinine, Ser: 1.36 mg/dL (ref 0.40–1.50)
GFR: 59.45 mL/min — ABNORMAL LOW (ref >60.00)
Glucose, Bld: 264 mg/dL — ABNORMAL HIGH (ref 70–99)
Potassium: 4 mEq/L (ref 3.5–5.1)
Sodium: 137 mEq/L (ref 135–145)
Total Bilirubin: 0.8 mg/dL (ref 0.2–1.2)
Total Protein: 7.3 g/dL (ref 6.0–8.3)

## 2022-05-19 LAB — MICROALBUMIN / CREATININE URINE RATIO
Creatinine,U: 254.4 mg/dL
Microalb Creat Ratio: 10.3 mg/g (ref 0.0–30.0)
Microalb, Ur: 26.3 mg/dL — ABNORMAL HIGH (ref 0.0–1.9)

## 2022-05-19 LAB — LIPID PANEL
Cholesterol: 251 mg/dL — ABNORMAL HIGH (ref 0–200)
HDL: 53.2 mg/dL (ref >39.00)
LDL Cholesterol: 160 mg/dL — ABNORMAL HIGH (ref 0–99)
NonHDL: 198.2
Total CHOL/HDL Ratio: 5
Triglycerides: 189 mg/dL — ABNORMAL HIGH (ref 0.0–149.0)
VLDL: 37.8 mg/dL (ref 0.0–40.0)

## 2022-05-19 LAB — HEMOGLOBIN A1C: Hgb A1c MFr Bld: 10 % — ABNORMAL HIGH (ref 4.6–6.5)

## 2022-05-19 LAB — PSA: PSA: 1.55 ng/mL (ref 0.10–4.00)

## 2022-05-19 NOTE — Assessment & Plan Note (Signed)
Pravastatin 20 mg last filled in 07/2021 #90/0. Further recommendation will be given according to lipid panel result.

## 2022-05-19 NOTE — Telephone Encounter (Signed)
On pcp's desk to be signed.  

## 2022-05-19 NOTE — Assessment & Plan Note (Signed)
We discussed the importance of regular physical activity and healthy diet for prevention of chronic illness and/or complications. Preventive guidelines reviewed. Vaccination up to date. Next CPE in a year. 

## 2022-05-19 NOTE — Telephone Encounter (Signed)
Completed & up front for pick up. I left patient a voicemail letting him know.

## 2022-05-19 NOTE — Telephone Encounter (Signed)
Pt stated that he discussed with Dr about filling out [place]; pt asked if it would be ready today but I informed pt that it probably will not be done today but I can check with the Dr.   Please advise.

## 2022-05-19 NOTE — Patient Instructions (Addendum)
A few things to remember from today's visit:  Routine general medical examination at a health care facility  Type 2 diabetes mellitus with hyperglycemia, without long-term current use of insulin ([place]) - Plan: Microalbumin / creatinine urine ratio, Hemoglobin A1c, Comprehensive metabolic panel  Hyperlipidemia associated with type 2 diabetes mellitus ([place]) - Plan: Lipid panel, Comprehensive metabolic panel  Prostate cancer screening - Plan: PSA(Must document that pt has been informed of limitations of PSA testing.)  Please arranged eye exam.  If you need refills for medications you take chronically, please call your pharmacy. Do not use My Chart to request refills or for acute issues that need immediate attention. If you send a my chart message, it may take a few days to be addressed, specially if I am not in the office.  Please be sure medication list is accurate. If a new problem present, please set up appointment sooner than planned today.  Health Maintenance, Male Adopting a healthy lifestyle and getting preventive care are important in promoting health and wellness. Ask your health care provider about: The right schedule for you to have regular tests and exams. Things you can do on your own to prevent diseases and keep yourself healthy. What should I know about diet, weight, and exercise? Eat a healthy diet  Eat a diet that includes plenty of vegetables, fruits, low-fat dairy products, and lean protein. Do not eat a lot of foods that are high in solid fats, added sugars, or sodium. Maintain a healthy weight Body mass index (BMI) is a measurement that can be used to identify possible weight problems. It estimates body fat based on height and weight. Your health care provider can help determine your BMI and help you achieve or maintain a healthy weight. Get regular exercise Get regular exercise. This is one of the most important things you can do for your health. Most adults  should: Exercise for at least 150 minutes each week. The exercise should increase your heart rate and make you sweat (moderate-intensity exercise). Do strengthening exercises at least twice a week. This is in addition to the moderate-intensity exercise. Spend less time sitting. Even light physical activity can be beneficial. Watch cholesterol and blood lipids Have your blood tested for lipids and cholesterol at 54 years of age, then have this test every 5 years. You may need to have your cholesterol levels checked more often if: Your lipid or cholesterol levels are high. You are older than 54 years of age. You are at high risk for heart disease. What should I know about cancer screening? Many types of cancers can be detected early and may often be prevented. Depending on your health history and family history, you may need to have cancer screening at various ages. This may include screening for: Colorectal cancer. Prostate cancer. Skin cancer. Lung cancer. What should I know about heart disease, diabetes, and high blood pressure? Blood pressure and heart disease High blood pressure causes heart disease and increases the risk of stroke. This is more likely to develop in people who have high blood pressure readings or are overweight. Talk with your health care provider about your target blood pressure readings. Have your blood pressure checked: Every 3-5 years if you are 15-28 years of age. Every year if you are 72 years old or older. If you are between the ages of 37 and 18 and are a current or former smoker, ask your health care provider if you should have a one-time screening for abdominal aortic aneurysm (  AAA). Diabetes Have regular diabetes screenings. This checks your fasting blood sugar level. Have the screening done: Once every three years after age 45 if you are at a normal weight and have a low risk for diabetes. More often and at a younger age if you are overweight or have a high  risk for diabetes. What should I know about preventing infection? Hepatitis B If you have a higher risk for hepatitis B, you should be screened for this virus. Talk with your health care provider to find out if you are at risk for hepatitis B infection. Hepatitis C Blood testing is recommended for: Everyone born from [birth year] through 1967. Anyone with known risk factors for hepatitis C. Sexually transmitted infections (STIs) You should be screened each year for STIs, including gonorrhea and chlamydia, if: You are sexually active and are younger than 54 years of age. You are older than 54 years of age and your health care provider tells you that you are at risk for this type of infection. Your sexual activity has changed since you were last screened, and you are at increased risk for chlamydia or gonorrhea. Ask your health care provider if you are at risk. Ask your health care provider about whether you are at high risk for HIV. Your health care provider may recommend a prescription medicine to help prevent HIV infection. If you choose to take medicine to prevent HIV, you should first get tested for HIV. You should then be tested every 3 months for as long as you are taking the medicine. Follow these instructions at home: Alcohol use Do not drink alcohol if your health care provider tells you not to drink. If you drink alcohol: Limit how much you have to 0-2 drinks a day. Know how much alcohol is in your drink. In the [place] one drink equals one 12 oz bottle of beer (355 mL), one 5 oz glass of wine (148 mL), or one 1 oz glass of hard liquor (44 mL). Lifestyle Do not use any products that contain nicotine or tobacco. These products include cigarettes, chewing tobacco, and vaping devices, such as e-cigarettes. If you need help quitting, ask your health care provider. Do not use street drugs. Do not share needles. Ask your health care provider for help if you need support or information about  quitting drugs. General instructions Schedule regular health, dental, and eye exams. Stay current with your vaccines. Tell your health care provider if: You often feel depressed. You have ever been abused or do not feel safe at home. Summary Adopting a healthy lifestyle and getting preventive care are important in promoting health and wellness. Follow your health care provider's instructions about healthy diet, exercising, and getting tested or screened for diseases. Follow your health care provider's instructions on monitoring your cholesterol and blood pressure. This information is not intended to replace advice given to you by your health care provider. Make sure you discuss any questions you have with your health care provider. Document Revised: 09/20/2020 Document Reviewed: 09/20/2020 Elsevier Patient Education  [employer].

## 2022-05-19 NOTE — Assessment & Plan Note (Signed)
Problem has not been well controlled. Not sure about compliance, last refill for Metformin XR 500 mg 2-3 months ago #30 tabs. Further recommendations according to HgA1C result. Possible complications of poorly controlled glucose discussed. Annual eye exam, periodic dental and foot care recommended. F/U in 5 months.

## 2022-05-20 MED ORDER — EMPAGLIFLOZIN-METFORMIN HCL 5-1000 MG PO TABS: 1.0000 | ORAL_TABLET | Freq: Two times a day (BID) | ORAL | 1 refills | Status: DC

## 2022-05-20 MED ORDER — ATORVASTATIN CALCIUM 20 MG PO TABS: 20.0000 mg | ORAL_TABLET | Freq: Every day | ORAL | 3 refills | Status: DC

## 2022-05-22 ENCOUNTER — Encounter: Payer: Self-pay | Admitting: Family Medicine

## 2022-05-23 ENCOUNTER — Encounter: Payer: BC Managed Care – PPO | Admitting: Family Medicine

## 2022-05-23 MED ORDER — ACCU-CHEK FASTCLIX LANCETS MISC: 3 refills | Status: AC

## 2022-05-23 MED ORDER — ACCU-CHEK GUIDE VI STRP: ORAL_STRIP | 3 refills | Status: AC

## 2022-05-23 NOTE — Addendum Note (Signed)
Addended by: Rodrigo Ran on: 05/23/2022 03:13 PM   Modules accepted: Orders

## 2022-06-10 ENCOUNTER — Other Ambulatory Visit: Payer: Self-pay | Admitting: Family Medicine

## 2022-06-10 DIAGNOSIS — E1165 Type 2 diabetes mellitus with hyperglycemia: Secondary | ICD-10-CM

## 2022-08-15 NOTE — Progress Notes (Unsigned)
HPI: [name] is a 52 y.o. male, who is here today for chronic disease management. He is following earlier because needs labs done for DOT purposes and needs form to be completed.  Last seen on 05/19/22.  Diabetes Mellitus II: Dx'ed in 07/2018.  He is not checking BS's recently. Currently he is on empagliflozin-metformin 09-998 mg bid. He made dietary changes since his last visit. - eye exam: Over a year ago. - foot exam: 05/19/22 - Negative for symptoms of hypoglycemia, polyuria, polydipsia, numbness extremities, foot ulcers/trauma  Lab Results  Component Value Date   HGBA1C 10.0 (H) 05/19/2022   Lab Results  Component Value Date   MICROALBUR 26.3 (H) 05/19/2022   Hyperlipidemia: Currently on Atorvastatin 20 mg daily. He has tolerated medication well. 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 The cold sores appear from the corner of his mouth to the edge of his upper lip. He has not identified exacerbating or alleviating factors.  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  Review of Systems  Constitutional:  Negative for chills and fever.  HENT:  Negative for sore throat and trouble swallowing.   Eyes:  Negative for redness and visual disturbance.  Respiratory:  Negative for cough and shortness of breath.   Cardiovascular:  Negative for chest pain, palpitations and leg swelling.  Gastrointestinal:  Negative for abdominal pain, nausea and vomiting.  Genitourinary:  Negative for decreased urine volume, dysuria and hematuria.  Skin:  Negative for rash.  Neurological:  Negative for syncope and headaches.  See other pertinent positives and negatives in HPI.  Current Outpatient Medications on File Prior to Visit   Medication Sig Dispense Refill   Accu-Chek FastClix Lancets MISC Use to check blood sugars 2-3 times daily. 300 each 3   atorvastatin (LIPITOR) 20 MG tablet Take 1 tablet (20 mg total) by mouth daily. 90 tablet 3   Empagliflozin-metFORMIN HCl 09-998 MG TABS Take 1 tablet by mouth 2 (two) times daily with a meal. 180 tablet 1   glucose blood (ACCU-CHEK GUIDE) test strip Use to test blood sugars 2-3 times daily. 300 each 3   No current facility-administered medications on file prior to visit.   Past Medical History:  Diagnosis Date   Diabetes mellitus without complication    No Known Allergies  Social History   Socioeconomic History   Marital status: Married    Spouse name: Not on file   Number of children: Not on file   Years of education: Not on file   Highest education level: Not on file  Occupational History   Not on file  Tobacco Use   Smoking status: Never   Smokeless tobacco: Never  Substance and Sexual Activity   Alcohol use: Not on file   Drug use: Not on file   Sexual activity: Yes  Other Topics Concern   Not on file  Social History Narrative   Not on file   Social Determinants of Health   Financial Resource Strain: Not on file  Food Insecurity: Not on file  Transportation Needs: Not on file  Physical Activity: Not on file  Stress: Not on file  Social Connections: Not on file   Vitals:  08/18/22 0743 08/18/22 0806  BP: (!) 130/90 116/80  Pulse: 94   Resp: 12   Temp: 98.6 F (37 C)   SpO2: 99%    Body mass index is 23.09 kg/m.  Physical Exam Vitals and nursing note reviewed.  Constitutional:      General: He is not in acute distress.    Appearance: He is well-developed.  HENT:     Head: Normocephalic and atraumatic.  Eyes:     Conjunctiva/sclera: Conjunctivae normal.  Cardiovascular:     Rate and Rhythm: Normal rate and regular rhythm.     Pulses:          Dorsalis pedis pulses are 2+ on the right side and 2+ on the left side.     Heart  sounds: No murmur heard. Pulmonary:     Effort: Pulmonary effort is normal. No respiratory distress.     Breath sounds: Normal breath sounds.  Abdominal:     Palpations: Abdomen is soft. There is no mass.     Tenderness: There is no abdominal tenderness.  Lymphadenopathy:     Cervical: No cervical adenopathy.  Skin:    General: Skin is warm.     Findings: No erythema or rash.     Comments: Great toenails with distal toenails detatched from bed nail. Mild amount of debris. No periungual edema or erythema.  Neurological:     Mental Status: He is alert and oriented to person, place, and time.     Cranial Nerves: No cranial nerve deficit.     Gait: Gait normal.  Psychiatric:        Mood and Affect: Mood and affect normal.   ASSESSMENT AND PLAN:  [name] was seen today for labs only.  Diagnoses and all orders for this visit:  Lab Results  Component Value Date   HGBA1C 6.9 (H) 08/18/2022   Lab Results  Component Value Date   CHOL 132 08/18/2022   HDL 48.90 08/18/2022   LDLCALC 68 08/18/2022   TRIG 77.0 08/18/2022   CHOLHDL 3 08/18/2022   Type 2 diabetes mellitus with hyperglycemia, without long-term current use of insulin Assessment & Plan: Problem has not been well controlled, BS's at home until a few weeks ago improved. Continue Empagliflozin-Metformin 09-998 mg bid. Possible complications discussed. Annual eye exam, periodic dental and foot care recommended. 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  Orders: -     Basic metabolic panel; Future -     Hemoglobin A1c; Future  Hyperlipidemia associated with type 2 diabetes mellitus Assessment & Plan: LDL was 160 in 05/2022. Continue Atorvastatin 20 mg daily and low fat diet. Further recommendations according to lipid panel results.  Orders: -     Lipid panel; Future -     Basic metabolic panel; Future  Recurrent herpes labialis Assessment & Plan: Currently  asymptomatic. Valtrex 500 mg bid x 3 days to take asap after acute flare ups. F/U annually.  Orders: -     valACYclovir HCl; To take as needed upon acute onset, 1 tab bid x 3 days.  Dispense: 18 tablet; Refill: 1  Return in about 6 months (around 02/17/2023) for chronic problems.  Charlean Carneal G. [place], MD  [facility]. [place] office.

## 2022-08-18 ENCOUNTER — Ambulatory Visit: Payer: BC Managed Care – PPO | Admitting: Family Medicine

## 2022-08-18 ENCOUNTER — Encounter: Payer: Self-pay | Admitting: Family Medicine

## 2022-08-18 ENCOUNTER — Telehealth: Payer: Self-pay | Admitting: Family Medicine

## 2022-08-18 VITALS — BP 116/80 | HR 94 | Temp 98.6°F | Resp 12 | Ht 73.0 in | Wt 175.0 lb

## 2022-08-18 DIAGNOSIS — B001 Herpesviral vesicular dermatitis: Secondary | ICD-10-CM

## 2022-08-18 DIAGNOSIS — E1169 Type 2 diabetes mellitus with other specified complication: Secondary | ICD-10-CM | POA: Diagnosis not present

## 2022-08-18 DIAGNOSIS — E1165 Type 2 diabetes mellitus with hyperglycemia: Secondary | ICD-10-CM | POA: Diagnosis not present

## 2022-08-18 DIAGNOSIS — E785 Hyperlipidemia, unspecified: Secondary | ICD-10-CM | POA: Diagnosis not present

## 2022-08-18 LAB — LIPID PANEL
Cholesterol: 132 mg/dL (ref 0–200)
HDL: 48.9 mg/dL (ref >39.00)
LDL Cholesterol: 68 mg/dL (ref 0–99)
NonHDL: 82.94
Total CHOL/HDL Ratio: 3
Triglycerides: 77 mg/dL (ref 0.0–149.0)
VLDL: 15.4 mg/dL (ref 0.0–40.0)

## 2022-08-18 LAB — BASIC METABOLIC PANEL
BUN: 14 mg/dL (ref 6–23)
CO2: 25 mEq/L (ref 19–32)
Calcium: 9.9 mg/dL (ref 8.4–10.5)
Chloride: 103 mEq/L (ref 96–112)
Creatinine, Ser: 1.21 mg/dL (ref 0.40–1.50)
GFR: 68.28 mL/min (ref >60.00)
Glucose, Bld: 135 mg/dL — ABNORMAL HIGH (ref 70–99)
Potassium: 4.5 mEq/L (ref 3.5–5.1)
Sodium: 137 mEq/L (ref 135–145)

## 2022-08-18 LAB — HEMOGLOBIN A1C: Hgb A1c MFr Bld: 6.9 % — ABNORMAL HIGH (ref 4.6–6.5)

## 2022-08-18 MED ORDER — VALACYCLOVIR HCL 500 MG PO TABS
ORAL_TABLET | ORAL | 1 refills | Status: DC
Start: 2022-08-18 — End: 2023-07-16

## 2022-08-18 NOTE — Patient Instructions (Addendum)
A few things to remember from today's visit:  Type 2 diabetes mellitus with hyperglycemia, without long-term current use of insulin - Plan: Basic metabolic panel, Hemoglobin A1c  Hyperlipidemia associated with type 2 diabetes mellitus - Plan: Lipid panel, Basic metabolic panel  Recurrent herpes labialis - Plan: valACYclovir (VALTREX) 500 MG tablet Valtrex to take as needed, 1 tab 2 times daily for 3 days.  If you need refills for medications you take chronically, please call your pharmacy. Do not use My Chart to request refills or for acute issues that need immediate attention. If you send a my chart message, it may take a few days to be addressed, specially if I am not in the office.  Please be sure medication list is accurate. If a new problem present, please set up appointment sooner than planned today.

## 2022-08-18 NOTE — Assessment & Plan Note (Signed)
Valtrex 500 mg bid x 3 days to take asap after acute flare ups.

## 2022-08-18 NOTE — Assessment & Plan Note (Signed)
Problem has not been well controlled, BS's at home until a few weeks ago improved. Continue Empagliflozin-Metformin 09-998 mg bid. Possible complications discussed. Annual eye exam, periodic dental and foot care recommended. 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

## 2022-08-18 NOTE — Assessment & Plan Note (Signed)
LDL was 160 in 05/2022. Continue Atorvastatin 20 mg daily and low fat diet. Further recommendations according to lipid panel results.

## 2022-08-18 NOTE — Telephone Encounter (Signed)
DOT form to be filled out/placed in dr's folder.  Please call pt for pickup upon completion.

## 2022-08-19 MED ORDER — EMPAGLIFLOZIN-METFORMIN HCL 5-1000 MG PO TABS
1.0000 | ORAL_TABLET | Freq: Two times a day (BID) | ORAL | 1 refills | Status: AC
Start: 2022-08-19 — End: 2023-02-15

## 2022-08-21 NOTE — Telephone Encounter (Signed)
I left patient a voicemail letting him know that the form is completed & up front for pick up. Copy sent to scan.

## 2022-08-23 ENCOUNTER — Encounter: Payer: Self-pay | Admitting: Family Medicine

## 2022-08-23 ENCOUNTER — Other Ambulatory Visit: Payer: Self-pay | Admitting: Family Medicine

## 2022-08-23 DIAGNOSIS — E1165 Type 2 diabetes mellitus with hyperglycemia: Secondary | ICD-10-CM

## 2022-08-23 NOTE — Telephone Encounter (Signed)
Error/njr °

## 2022-12-03 ENCOUNTER — Other Ambulatory Visit: Payer: Self-pay | Admitting: Family Medicine

## 2022-12-03 DIAGNOSIS — E1165 Type 2 diabetes mellitus with hyperglycemia: Secondary | ICD-10-CM

## 2023-02-28 ENCOUNTER — Telehealth: Payer: Self-pay | Admitting: Family Medicine

## 2023-02-28 NOTE — Telephone Encounter (Signed)
Pt called to say he needs paperwork completed for DOT.  Pt is requesting Labs for DOT, but does not want to see MD.  Pt informed MD must put in an order for labs, then we can call him back to schedule.  Please advise.

## 2023-02-28 NOTE — Telephone Encounter (Signed)
Fasting & just regular OV.

## 2023-02-28 NOTE — Telephone Encounter (Signed)
He is due for follow up so will need to see PCP & paperwork/labs can be done during visit.

## 2023-02-28 NOTE — Telephone Encounter (Signed)
He really did say he did not want to see PCP, just do labs. I can call him back to schedule the visit. Regular OV or CPE? Will he need to fast? Please advise.

## 2023-02-28 NOTE — Telephone Encounter (Signed)
Called Pt to schedule for Regular OV plus labs. LVM for Pt to call us back to schedule.

## 2023-03-01 ENCOUNTER — Encounter: Payer: Self-pay | Admitting: Family Medicine

## 2023-03-01 NOTE — Telephone Encounter (Signed)
Pt said he would seen md a message and that md agreed to just put in lab  order w/o pt seeing dr [place]

## 2023-03-06 ENCOUNTER — Encounter: Payer: Self-pay | Admitting: Family Medicine

## 2023-03-06 ENCOUNTER — Ambulatory Visit (INDEPENDENT_AMBULATORY_CARE_PROVIDER_SITE_OTHER): Payer: BC Managed Care – PPO | Admitting: Family Medicine

## 2023-03-06 VITALS — BP 120/68 | HR 106 | Resp 12 | Ht 73.0 in | Wt 179.1 lb

## 2023-03-06 DIAGNOSIS — Z23 Encounter for immunization: Secondary | ICD-10-CM | POA: Diagnosis not present

## 2023-03-06 DIAGNOSIS — E1165 Type 2 diabetes mellitus with hyperglycemia: Secondary | ICD-10-CM | POA: Diagnosis not present

## 2023-03-06 DIAGNOSIS — Z7984 Long term (current) use of oral hypoglycemic drugs: Secondary | ICD-10-CM

## 2023-03-06 LAB — POCT GLYCOSYLATED HEMOGLOBIN (HGB A1C): HbA1c, POC (controlled diabetic range): 6.8 % (ref 0.0–7.0)

## 2023-03-06 MED ORDER — EMPAGLIFLOZIN-METFORMIN HCL 5-1000 MG PO TABS: 1.0000 | ORAL_TABLET | Freq: Two times a day (BID) | ORAL | 2 refills | Status: DC

## 2023-03-06 NOTE — Patient Instructions (Addendum)
A few things to remember from today's visit:  Type 2 diabetes mellitus with hyperglycemia, without long-term current use of insulin (HCC) - Plan: POC HgB A1c No changes today.  If you need refills for medications you take chronically, please call your pharmacy. Do not use My Chart to request refills or for acute issues that need immediate attention. If you send a my chart message, it may take a few days to be addressed, specially if I am not in the office.  Please be sure medication list is accurate. If a new problem present, please set up appointment sooner than planned today.

## 2023-03-06 NOTE — Assessment & Plan Note (Signed)
Problem is adequately controlled. HgA1C today was 6.8 (6.9). Continue Empagliflozin-Metformin 09-998 mg bid. Possible complications discussed. Annual eye exam (overdue), periodic dental and foot care recommended. F/U in 6 months.

## 2023-03-06 NOTE — Progress Notes (Signed)
HPI: William Rocha is a 52 y.o. male with a PMHx significant for HLD and DM II, who is here today for chronic disease management.  Last seen on 08/18/2022  Diabetes Mellitus II:  Dx'ed in 07/2018.  - Checking BG at home: He has not been checking his blood sugar at home. - Medications: He is currently on empagliflozin/metformin 09-998 mg bid.  - eye exam: He hasn't seen an eye doctor recently.  - foot exam: Performed on last visit on 08/18/2022.  - microalbumin: last done in 03/2022 - Negative for symptoms of hypoglycemia, polyuria, polydipsia, numbness extremities, foot ulcers/trauma  HgA1C 6.9 in 08/2022.  Lab Results  Component Value Date   MICROALBUR 26.3 (H) 05/19/2022   He is on Atorvastatin 20 mg daily.  Lab Results  Component Value Date   CHOL 132 08/18/2022   HDL 48.90 08/18/2022   LDLCALC 68 08/18/2022   TRIG 77.0 08/18/2022   CHOLHDL 3 08/18/2022   He does not check his BP regularly at home.   He is upset because he has to be seen instead having just blood work. 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  Review of Systems  Constitutional:  Negative for activity change, appetite change and fever.  Eyes:  Negative for redness and visual disturbance.  Respiratory:  Negative for apnea, cough, shortness of breath and wheezing.   Cardiovascular:  Negative for chest pain, palpitations and leg swelling.  Gastrointestinal:  Negative for abdominal pain, nausea and vomiting.  Genitourinary:  Negative for decreased urine volume, dysuria and hematuria.  Neurological:  Negative for syncope, weakness and headaches.  See other pertinent positives and negatives in HPI.  Current Outpatient Medications on File Prior to Visit  Medication Sig Dispense Refill   Accu-Chek FastClix Lancets MISC Use to check blood sugars 2-3 times daily. 300 each 3   atorvastatin (LIPITOR) 20 MG tablet Take 1 tablet (20 mg total) by mouth daily. 90 tablet 3   glucose blood (ACCU-CHEK GUIDE) test strip Use  to test blood sugars 2-3 times daily. 300 each 3   valACYclovir (VALTREX) 500 MG tablet To take as needed upon acute onset, 1 tab bid x 3 days. 18 tablet 1   No current facility-administered medications on file prior to visit.    Past Medical History:  Diagnosis Date   Diabetes mellitus without complication (HCC)    No Known Allergies  Social History   Socioeconomic History   Marital status: Married    Spouse name: Not on file   Number of children: Not on file   Years of education: Not on file   Highest education level: Not on file  Occupational History   Not on file  Tobacco Use   Smoking status: Never   Smokeless tobacco: Never  Substance and Sexual Activity   Alcohol use: Not on file   Drug use: Not on file   Sexual activity: Yes  Other Topics Concern   Not on file  Social History Narrative   Not on file   Social Determinants of Health   Financial Resource Strain: Not on file  Food Insecurity: Not on file  Transportation Needs: Not on file  Physical Activity: Not on file  Stress: Not on file  Social Connections: Not on file   Vitals:   03/06/23 1201  BP: 120/68  Pulse: (!) 106  Resp: 12  SpO2: 97%   Body mass index is 23.63 kg/m.  Physical Exam Vitals and nursing note reviewed.  Constitutional:  General: He is not in acute distress.    Appearance: He is well-developed.  HENT:     Head: Normocephalic and atraumatic.  Eyes:     Conjunctiva/sclera: Conjunctivae normal.  Cardiovascular:     Rate and Rhythm: Regular rhythm. Tachycardia present.     Pulses:          Dorsalis pedis pulses are 2+ on the right side and 2+ on the left side.     Heart sounds: No murmur heard. Pulmonary:     Effort: Pulmonary effort is normal. No respiratory distress.     Breath sounds: Normal breath sounds.  Abdominal:     Palpations: Abdomen is soft. There is no hepatomegaly or mass.     Tenderness: There is no abdominal tenderness.  Lymphadenopathy:      Cervical: No cervical adenopathy.  Skin:    General: Skin is warm.     Findings: No erythema or rash.  Neurological:     Mental Status: He is alert and oriented to person, place, and time.     Cranial Nerves: No cranial nerve deficit.     Gait: Gait normal.  Psychiatric:        Mood and Affect: Mood and affect normal.   ASSESSMENT AND PLAN:  Mr. Rayas was seen today for chronic disease management.  Orders Placed This Encounter  Procedures   Flu vaccine trivalent PF, 6mos and older(Flulaval,Afluria,Fluarix,Fluzone)   POC HgB A1c   Type 2 diabetes mellitus with hyperglycemia (HCC) Problem is adequately controlled. HgA1C today was 6.8 (6.9). Continue Empagliflozin-Metformin 09-998 mg bid. Possible complications discussed. Annual eye exam (overdue), periodic dental and foot care recommended. F/U in 6 months.  I spent a total of 31 minutes in both face to face and non face to face activities for this visit on the date of this encounter. During this time history was obtained and documented, examination was performed, prior labs reviewed, and assessment/plan discussed. Spends about 50% of visit discussing general recommendations about DM II care. Explained that I need to see him q 6 months as far as problems are well controlled. If HgA1C is required earlier for DOT purposes, lab appt can be arranged. We usually arrange CPE and f/u, he does not pay copay for CPE but we still follow on DM II.   Return in about 6 months (around 09/04/2023) for CPE.  I, Rolla Etienne Wierda, acting as a scribe for Gracey Tolle [place], MD., have documented all relevant documentation on the behalf of Eean Buss [place], MD, as directed by  Maksym Pfiffner [place], MD while in the presence of Maleya Leever [place], MD.   I, Willadeen Colantuono [place], MD, have reviewed all documentation for this visit. The documentation on 03/08/23 for the exam, diagnosis, procedures, and orders are all accurate and complete.  Darrion Wyszynski G. [place], MD  [facility]. [place] office.

## 2023-03-08 ENCOUNTER — Encounter: Payer: Self-pay | Admitting: Family Medicine

## 2023-07-16 ENCOUNTER — Other Ambulatory Visit: Payer: Self-pay | Admitting: Family Medicine

## 2023-07-16 DIAGNOSIS — B001 Herpesviral vesicular dermatitis: Secondary | ICD-10-CM

## 2023-10-26 ENCOUNTER — Other Ambulatory Visit: Payer: Self-pay | Admitting: Occupational Medicine

## 2023-10-26 ENCOUNTER — Ambulatory Visit
Admission: RE | Admit: 2023-10-26 | Discharge: 2023-10-26 | Disposition: A | Discharge status: Home / Self Care | Payer: Worker's Compensation | Source: Ambulatory Visit | Attending: Occupational Medicine | Admitting: Occupational Medicine

## 2023-10-26 DIAGNOSIS — S39012A Strain of muscle, fascia and tendon of lower back, initial encounter: Secondary | ICD-10-CM

## 2023-10-26 DIAGNOSIS — S39013A Strain of muscle, fascia and tendon of pelvis, initial encounter: Secondary | ICD-10-CM

## 2023-11-09 ENCOUNTER — Ambulatory Visit
Admission: RE | Admit: 2023-11-09 | Discharge: 2023-11-09 | Disposition: A | Discharge status: Home / Self Care | Payer: Worker's Compensation | Source: Ambulatory Visit | Attending: Family Medicine | Admitting: Family Medicine

## 2023-11-09 ENCOUNTER — Other Ambulatory Visit: Payer: Self-pay | Admitting: Family Medicine

## 2023-11-09 DIAGNOSIS — S39013A Strain of muscle, fascia and tendon of pelvis, initial encounter: Secondary | ICD-10-CM

## 2024-02-11 ENCOUNTER — Ambulatory Visit (INDEPENDENT_AMBULATORY_CARE_PROVIDER_SITE_OTHER): Admitting: Podiatry

## 2024-02-11 ENCOUNTER — Encounter: Payer: Self-pay | Admitting: Podiatry

## 2024-02-11 VITALS — Ht 73.0 in | Wt 179.0 lb

## 2024-02-11 DIAGNOSIS — L309 Dermatitis, unspecified: Secondary | ICD-10-CM

## 2024-02-11 DIAGNOSIS — B351 Tinea unguium: Secondary | ICD-10-CM | POA: Diagnosis not present

## 2024-02-11 MED ORDER — TERBINAFINE HCL 250 MG PO TABS: 250.0000 mg | ORAL_TABLET | Freq: Every day | ORAL | 0 refills | Status: AC

## 2024-02-11 NOTE — Progress Notes (Signed)
 Subjective:   Patient ID: William Rocha, male   DOB: 53 y.o.   MRN: [id number]   HPI Patient states that he has nail disease of his big toenails of both feet and that he does know that he played sports and that may be part of this.  Patient does not like the way it looks and is willing to try to get this better in any way.  Patient does not smoke and is very active   Review of Systems  All other systems reviewed and are negative.       Objective:  Physical Exam Vitals and nursing note reviewed.  Constitutional:      Appearance: He is well-developed.  Pulmonary:     Effort: Pulmonary effort is normal.  Musculoskeletal:        General: Normal range of motion.  Skin:    General: Skin is warm.  Neurological:     Mental Status: He is alert.     Neurovascular status intact muscle strength found to be adequate range of motion adequate with significant thickness and discoloration of the hallux nailbeds bilateral and also very dry skin with itching compounded bilateral feet.  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  He is willing to take this risk I am ordering liver function studies that he will do today with requisition given to him and I did write him a prescription for Lamisil 90 days 250 mg to take and he is scheduled for laser treatment.  All questions answered

## 2024-02-15 ENCOUNTER — Ambulatory Visit (INDEPENDENT_AMBULATORY_CARE_PROVIDER_SITE_OTHER): Admitting: Family Medicine

## 2024-02-15 ENCOUNTER — Encounter: Payer: Self-pay | Admitting: Family Medicine

## 2024-02-15 VITALS — BP 136/80 | HR 90 | Temp 97.9°F | Resp 16 | Ht 73.0 in | Wt 181.0 lb

## 2024-02-15 DIAGNOSIS — E785 Hyperlipidemia, unspecified: Secondary | ICD-10-CM

## 2024-02-15 DIAGNOSIS — Z0000 Encounter for general adult medical examination without abnormal findings: Secondary | ICD-10-CM | POA: Diagnosis not present

## 2024-02-15 DIAGNOSIS — Z23 Encounter for immunization: Secondary | ICD-10-CM | POA: Diagnosis not present

## 2024-02-15 DIAGNOSIS — Z125 Encounter for screening for malignant neoplasm of prostate: Secondary | ICD-10-CM

## 2024-02-15 DIAGNOSIS — E1169 Type 2 diabetes mellitus with other specified complication: Secondary | ICD-10-CM | POA: Diagnosis not present

## 2024-02-15 DIAGNOSIS — Z7984 Long term (current) use of oral hypoglycemic drugs: Secondary | ICD-10-CM | POA: Diagnosis not present

## 2024-02-15 DIAGNOSIS — E1165 Type 2 diabetes mellitus with hyperglycemia: Secondary | ICD-10-CM

## 2024-02-15 LAB — COMPREHENSIVE METABOLIC PANEL WITH GFR
ALT: 19 U/L (ref 0–53)
AST: 20 U/L (ref 0–37)
Albumin: 4.8 g/dL (ref 3.5–5.2)
Alkaline Phosphatase: 86 U/L (ref 39–117)
BUN: 14 mg/dL (ref 6–23)
CO2: 26 meq/L (ref 19–32)
Calcium: 9.5 mg/dL (ref 8.4–10.5)
Chloride: 103 meq/L (ref 96–112)
Creatinine, Ser: 1.55 mg/dL — ABNORMAL HIGH (ref 0.40–1.50)
GFR: 50.19 mL/min — ABNORMAL LOW (ref >60.00)
Glucose, Bld: 131 mg/dL — ABNORMAL HIGH (ref 70–99)
Potassium: 4.5 meq/L (ref 3.5–5.1)
Sodium: 139 meq/L (ref 135–145)
Total Bilirubin: 0.6 mg/dL (ref 0.2–1.2)
Total Protein: 7.3 g/dL (ref 6.0–8.3)

## 2024-02-15 LAB — LIPID PANEL
Cholesterol: 190 mg/dL (ref 0–200)
HDL: 43.4 mg/dL (ref >39.00)
LDL Cholesterol: 103 mg/dL — ABNORMAL HIGH (ref 0–99)
NonHDL: 146.73
Total CHOL/HDL Ratio: 4
Triglycerides: 221 mg/dL — ABNORMAL HIGH (ref 0.0–149.0)
VLDL: 44.2 mg/dL — ABNORMAL HIGH (ref 0.0–40.0)

## 2024-02-15 LAB — PSA: PSA: 1.12 ng/mL (ref 0.10–4.00)

## 2024-02-15 LAB — HEMOGLOBIN A1C: Hgb A1c MFr Bld: 7.5 % — ABNORMAL HIGH (ref 4.6–6.5)

## 2024-02-15 LAB — MICROALBUMIN / CREATININE URINE RATIO
Creatinine,U: 99.6 mg/dL
Microalb Creat Ratio: UNDETERMINED mg/g (ref 0.0–30.0)
Microalb, Ur: <0.7 mg/dL

## 2024-02-15 MED ORDER — ATORVASTATIN CALCIUM 20 MG PO TABS: 20.0000 mg | ORAL_TABLET | Freq: Every day | ORAL | 3 refills | Status: AC

## 2024-02-15 NOTE — Progress Notes (Signed)
 Chief Complaint  Patient presents with   Annual Exam   Follow-up    HPI: [name] is a 53 y.o.male with a PMHx significant for HLD and DM II here today for his routine physical examination and follow up..  Last CPE: 05/19/22 Last follow up visit 03/06/23. No new problems since his last visit.  He exercises regularly, walking a few times per week, weightlifting 2-4 times per week depending of working schedule. He is out and cooks at home some, tries to eat vegetables daily. He has not had an eye exam in over a year. He sees his dentist twice per year. He does not drink alcohol frequently unless he is sick with a respiratory tract infection, in which case he drinks a shot. No history of tobacco use.  Immunization History  Administered Date(s) Administered   Influenza, Seasonal, Injecte, Preservative Fre 03/06/2023   Influenza,inj,Quad PF,6+ Mos 06/20/2019, 05/19/2022   PFIZER(Purple Top)SARS-COV-2 Vaccination 05/01/2020   Pneumococcal Polysaccharide-23 08/05/2018   Tdap 08/05/2018    Health Maintenance  Topic Date Due   Diabetic kidney evaluation - Urine ACR  Never done   Hepatitis B Vaccines 19-59 Average Risk (1 of 3 - 19+ 3-dose series) Never done   OPHTHALMOLOGY EXAM  02/13/2019   Pneumococcal Vaccine: 50+ Years (2 of 2 - PCV) 08/05/2019   Diabetic kidney evaluation - eGFR measurement  08/18/2023   HEMOGLOBIN A1C  09/04/2023   Influenza Vaccine  12/14/2023   COVID-19 Vaccine (2 - 2025-26 season) 01/14/2024   HIV Screening  11/06/2025 (Originally 12/13/1985)   Zoster Vaccines- Shingrix (1 of 2) 05/15/2029 (Originally 12/13/2018)   FOOT EXAM  02/14/2025   Colonoscopy  02/08/2027   DTaP/Tdap/Td (2 - Td or Tdap) 08/04/2028   Hepatitis C Screening  Completed   HPV VACCINES  Aged Out   Meningococcal B Vaccine  Aged Out    Last prostate ca screening: No changes in urinary frequency. Lab Results  Component Value Date   PSA 1.55 05/19/2022   PSA 1.10  11/03/2020   Recently evaluated by podiatrist, he is currently on treatment with oral antimycotic medication, Lamisil, to treat onychomycosis.  Diabetes Mellitus II:Dx'ed in 07/2018  Currently he is on empagliflozin -metformin  09-998 mg bid, which he takes with empty stomach and causing diarrhea.  He states that he has been compliant with taking medications since his last visit. He is not monitoring BS.  Negative for symptoms of hypoglycemia, polyuria, polydipsia, numbness extremities, foot ulcers/trauma  Lab Results  Component Value Date   HGBA1C 6.8 03/06/2023   Hyperlipidemia: He has not taken atorvastatin  20 mg in over a year. Lab Results  Component Value Date   CHOL 132 08/18/2022   HDL 48.90 08/18/2022   LDLCALC 68 08/18/2022   TRIG 77.0 08/18/2022   CHOLHDL 3 08/18/2022   Lab Results  Component Value Date   NA 137 08/18/2022   CL 103 08/18/2022   K 4.5 Hemolysis seen... 08/18/2022   CO2 25 08/18/2022   BUN 14 08/18/2022   CREATININE 1.21 08/18/2022   GFR 68.28 08/18/2022   CALCIUM  9.9 08/18/2022   ALBUMIN 4.6 05/19/2022   GLUCOSE 135 (H) 08/18/2022   Review of Systems  Constitutional:  Negative for activity change, appetite change and fever.  HENT:  Negative for mouth sores, sore throat and trouble swallowing.   Eyes:  Negative for redness and visual disturbance.  Respiratory:  Negative for cough, shortness of breath and wheezing.   Cardiovascular:  Negative for  chest pain, palpitations and leg swelling.  Gastrointestinal:  Negative for abdominal pain, blood in stool, nausea and vomiting.  Endocrine: Negative for cold intolerance, heat intolerance, polydipsia, polyphagia and polyuria.  Genitourinary:  Negative for decreased urine volume, dysuria, genital sores, hematuria and testicular pain.  Musculoskeletal:  Negative for gait problem and myalgias.  Skin:  Negative for color change and rash.  Allergic/Immunologic: Negative for environmental allergies.   Neurological:  Negative for syncope, weakness and headaches.  Hematological:  Negative for adenopathy. Does not bruise/bleed easily.  Psychiatric/Behavioral:  Negative for confusion. The patient is not nervous/anxious.    Current Outpatient Medications on File Prior to Visit  Medication Sig Dispense Refill   Accu-Chek FastClix Lancets MISC Use to check blood sugars 2-3 times daily. 300 each 3   Empagliflozin -metFORMIN  HCl 09-998 MG TABS Take 1 tablet by mouth 2 (two) times daily with a meal. 180 tablet 2   glucose blood (ACCU-CHEK GUIDE) test strip Use to test blood sugars 2-3 times daily. 300 each 3   terbinafine (LAMISIL) 250 MG tablet Take 1 tablet (250 mg total) by mouth daily. 90 tablet 0   valACYclovir  (VALTREX ) 500 MG tablet TO TAKE AS NEEDED UPON ACUTE ONSET, 1 TAB 2 TIMES A DAY X 3 DAYS. 18 tablet 1   No current facility-administered medications on file prior to visit.   Past Medical History:  Diagnosis Date   Diabetes mellitus without complication (HCC)    History reviewed. No pertinent surgical history.  No Known Allergies  Family History  Problem Relation Age of Onset   Cancer Mother        ? colon   Diabetes Mother    Diabetes Father    Diabetes Sister    Social History   Socioeconomic History   Marital status: Married    Spouse name: Not on file   Number of children: Not on file   Years of education: Not on file   Highest education level: Not on file  Occupational History   Not on file  Tobacco Use   Smoking status: Never   Smokeless tobacco: Never  Substance and Sexual Activity   Alcohol use: Not Currently   Drug use: Never   Sexual activity: Yes  Other Topics Concern   Not on file  Social History Narrative   Not on file   Social Drivers of Health   Financial Resource Strain: Not on file  Food Insecurity: Not on file  Transportation Needs: Not on file  Physical Activity: Not on file  Stress: Not on file  Social Connections: Not on file    Vitals:   02/15/24 1039  BP: 136/80  Pulse: 90  Resp: 16  Temp: 97.9 F (36.6 C)  SpO2: 97%   Body mass index is 23.88 kg/m.  Wt Readings from Last 3 Encounters:  02/15/24 181 lb (82.1 kg)  02/11/24 179 lb (81.2 kg)  03/06/23 179 lb 2 oz (81.3 kg)   Physical Exam Vitals and nursing note reviewed.  Constitutional:      General: He is not in acute distress.    Appearance: He is well-developed.  HENT:     Head: Normocephalic and atraumatic.     Right Ear: Tympanic membrane, ear canal and external ear normal.     Left Ear: Tympanic membrane, ear canal and external ear normal.  Eyes:     Extraocular Movements: Extraocular movements intact.     Conjunctiva/sclera: Conjunctivae normal.     Pupils: Pupils are equal, round,  and reactive to light.  Neck:     Thyroid: No thyroid mass or thyromegaly.  Cardiovascular:     Rate and Rhythm: Normal rate and regular rhythm.     Pulses:          Dorsalis pedis pulses are 2+ on the right side and 2+ on the left side.     Heart sounds: No murmur heard. Pulmonary:     Effort: Pulmonary effort is normal. No respiratory distress.     Breath sounds: Normal breath sounds.  Abdominal:     Palpations: Abdomen is soft. There is no hepatomegaly or mass.     Tenderness: There is no abdominal tenderness.  Genitourinary:    Comments: No concerns. Musculoskeletal:        General: No tenderness.     Cervical back: Normal range of motion.     Comments: No major deformities appreciated and no signs of synovitis.  Lymphadenopathy:     Cervical: No cervical adenopathy.     Upper Body:     Right upper body: No supraclavicular adenopathy.     Left upper body: No supraclavicular adenopathy.  Skin:    General: Skin is warm.     Findings: No erythema.  Neurological:     General: No focal deficit present.     Mental Status: He is alert and oriented to person, place, and time.     Cranial Nerves: No cranial nerve deficit.     Sensory: No sensory  deficit.     Gait: Gait normal.     Deep Tendon Reflexes:     Reflex Scores:      Bicep reflexes are 2+ on the right side and 2+ on the left side.      Patellar reflexes are 2+ on the right side and 2+ on the left side. Psychiatric:        Mood and Affect: Mood and affect normal.    ASSESSMENT AND PLAN:  William Rocha was seen today for annual exam and follow-up.  Diagnoses and all orders for this visit: Orders Placed This Encounter  Procedures   Flu vaccine trivalent PF, 6mos and older(Flulaval,Afluria,Fluarix,Fluzone)   Pneumococcal conjugate vaccine 20-valent (Prevnar 20)   Comprehensive metabolic panel with GFR   Hemoglobin A1c   Microalbumin / creatinine urine ratio   Lipid panel   PSA   Lab Results  Component Value Date   PSA 1.12 02/15/2024   PSA 1.55 05/19/2022   PSA 1.10 11/03/2020   Lab Results  Component Value Date   HGBA1C 7.5 (H) 02/15/2024   Lab Results  Component Value Date   NA 139 02/15/2024   CL 103 02/15/2024   K 4.5 02/15/2024   CO2 26 02/15/2024   BUN 14 02/15/2024   CREATININE 1.55 (H) 02/15/2024   GFR 50.19 (L) 02/15/2024   CALCIUM  9.5 02/15/2024   ALBUMIN 4.8 02/15/2024   GLUCOSE 131 (H) 02/15/2024   Lab Results  Component Value Date   ALT 19 02/15/2024   AST 20 02/15/2024   ALKPHOS 86 02/15/2024   BILITOT 0.6 02/15/2024   Lab Results  Component Value Date   CHOL 190 02/15/2024   HDL 43.40 02/15/2024   LDLCALC 103 (H) 02/15/2024   TRIG 221.0 (H) 02/15/2024   CHOLHDL 4 02/15/2024   Routine general medical examination at a health care facility Assessment & Plan: We discussed the importance of regular physical activity and healthy diet for prevention of chronic illness and/or complications. Preventive guidelines reviewed. Vaccination  updated, Prevnar 20 and flu vaccine given today. Next CPE in a year.   Type 2 diabetes mellitus with hyperglycemia, without long-term current use of insulin (HCC) Assessment & Plan: Last follow  up a year ago. HgA1C today was 6.8 in 02/2023. Continue Empagliflozin -Metformin  09-998 mg bid but with food to prevent diarrhea. Shiela grout eye exam. Continue periodic dental preventive care and foot care. F/U in 6 months.  Orders: -     Hemoglobin A1c; Future -     Microalbumin / creatinine urine ratio; Future  Hyperlipidemia associated with type 2 diabetes mellitus (HCC) Assessment & Plan: He has not been on Atorvastatin  20 mg for at least a year. Recommend resuming Atorvastatin  20 mg daily. Low fat diet also recommended.  Orders: -     Comprehensive metabolic panel with GFR; Future -     Lipid panel; Future -     Atorvastatin  Calcium ; Take 1 tablet (20 mg total) by mouth daily.  Dispense: 90 tablet; Refill: 3  Need for vaccination -     Flu vaccine trivalent PF, 6mos and older(Flulaval,Afluria,Fluarix,Fluzone) -     Pneumococcal conjugate vaccine 20-valent  Prostate cancer screening -     PSA; Future  Return in 6 months (on 08/15/2024) for chronic problems.  Jonda Alanis G. [place], MD  [facility]. [place] office.

## 2024-02-15 NOTE — Patient Instructions (Addendum)
 A few things to remember from today's visit:  Routine general medical examination at a health care facility  Type 2 diabetes mellitus with hyperglycemia, without long-term current use of insulin (HCC) - Plan: Hemoglobin A1c, Microalbumin / creatinine urine ratio  Hyperlipidemia associated with type 2 diabetes mellitus (HCC) - Plan: Comprehensive metabolic panel with GFR, Lipid panel  Need for vaccination - Plan: Flu vaccine trivalent PF, 6mos and older(Flulaval,Afluria,Fluarix,Fluzone), Pneumococcal conjugate vaccine 20-valent (Prevnar 20)  Resume Atorvastatin  same dose. Take Empagliflozin -Metformin  with food, when start eating or in the middle of the meal.  If you need refills for medications you take chronically, please call your pharmacy. Do not use My Chart to request refills or for acute issues that need immediate attention. If you send a my chart message, it may take a few days to be addressed, specially if I am not in the office.  Please be sure medication list is accurate. If a new problem present, please set up appointment sooner than planned today.  Health Maintenance, Male Adopting a healthy lifestyle and getting preventive care are important in promoting health and wellness. Ask your health care provider about: The right schedule for you to have regular tests and exams. Things you can do on your own to prevent diseases and keep yourself healthy. What should I know about diet, weight, and exercise? Eat a healthy diet  Eat a diet that includes plenty of vegetables, fruits, low-fat dairy products, and lean protein. Do not eat a lot of foods that are high in solid fats, added sugars, or sodium. Maintain a healthy weight Body mass index (BMI) is a measurement that can be used to identify possible weight problems. It estimates body fat based on height and weight. Your health care provider can help determine your BMI and help you achieve or maintain a healthy weight. Get regular  exercise Get regular exercise. This is one of the most important things you can do for your health. Most adults should: Exercise for at least 150 minutes each week. The exercise should increase your heart rate and make you sweat (moderate-intensity exercise). Do strengthening exercises at least twice a week. This is in addition to the moderate-intensity exercise. Spend less time sitting. Even light physical activity can be beneficial. Watch cholesterol and blood lipids Have your blood tested for lipids and cholesterol at 55 years of age, then have this test every 5 years. You may need to have your cholesterol levels checked more often if: Your lipid or cholesterol levels are high. You are older than 55 years of age. You are at high risk for heart disease. What should I know about cancer screening? Many types of cancers can be detected early and may often be prevented. Depending on your health history and family history, you may need to have cancer screening at various ages. This may include screening for: Colorectal cancer. Prostate cancer. Skin cancer. Lung cancer. What should I know about heart disease, diabetes, and high blood pressure? Blood pressure and heart disease High blood pressure causes heart disease and increases the risk of stroke. This is more likely to develop in people who have high blood pressure readings or are overweight. Talk with your health care provider about your target blood pressure readings. Have your blood pressure checked: Every 3-5 years if you are 81-60 years of age. Every year if you are 59 years old or older. If you are between the ages of 3 and 91 and are a current or former smoker, ask your  health care provider if you should have a one-time screening for abdominal aortic aneurysm (AAA). Diabetes Have regular diabetes screenings. This checks your fasting blood sugar level. Have the screening done: Once every three years after age 33 if you are at a  normal weight and have a low risk for diabetes. More often and at a younger age if you are overweight or have a high risk for diabetes. What should I know about preventing infection? Hepatitis B If you have a higher risk for hepatitis B, you should be screened for this virus. Talk with your health care provider to find out if you are at risk for hepatitis B infection. Hepatitis C Blood testing is recommended for: Everyone born from [birth year] through 1967. Anyone with known risk factors for hepatitis C. Sexually transmitted infections (STIs) You should be screened each year for STIs, including gonorrhea and chlamydia, if: You are sexually active and are younger than 55 years of age. You are older than 55 years of age and your health care provider tells you that you are at risk for this type of infection. Your sexual activity has changed since you were last screened, and you are at increased risk for chlamydia or gonorrhea. Ask your health care provider if you are at risk. Ask your health care provider about whether you are at high risk for HIV. Your health care provider may recommend a prescription medicine to help prevent HIV infection. If you choose to take medicine to prevent HIV, you should first get tested for HIV. You should then be tested every 3 months for as long as you are taking the medicine. Follow these instructions at home: Alcohol use Do not drink alcohol if your health care provider tells you not to drink. If you drink alcohol: Limit how much you have to 0-2 drinks a day. Know how much alcohol is in your drink. In the [place] one drink equals one 12 oz bottle of beer (355 mL), one 5 oz glass of wine (148 mL), or one 1 oz glass of hard liquor (44 mL). Lifestyle Do not use any products that contain nicotine or tobacco. These products include cigarettes, chewing tobacco, and vaping devices, such as e-cigarettes. If you need help quitting, ask your health care provider. Do not use street  drugs. Do not share needles. Ask your health care provider for help if you need support or information about quitting drugs. General instructions Schedule regular health, dental, and eye exams. Stay current with your vaccines. Tell your health care provider if: You often feel depressed. You have ever been abused or do not feel safe at home. Summary Adopting a healthy lifestyle and getting preventive care are important in promoting health and wellness. Follow your health care provider's instructions about healthy diet, exercising, and getting tested or screened for diseases. Follow your health care provider's instructions on monitoring your cholesterol and blood pressure. This information is not intended to replace advice given to you by your health care provider. Make sure you discuss any questions you have with your health care provider. Document Revised: 09/20/2020 Document Reviewed: 09/20/2020 Elsevier Patient Education  2024 [employer].

## 2024-02-15 NOTE — Assessment & Plan Note (Addendum)
 We discussed the importance of regular physical activity and healthy diet for prevention of chronic illness and/or complications. Preventive guidelines reviewed. Vaccination updated, Prevnar 20 and flu vaccine given today. Next CPE in a year.

## 2024-02-16 ENCOUNTER — Ambulatory Visit: Payer: Self-pay | Admitting: Family Medicine

## 2024-02-16 NOTE — Assessment & Plan Note (Addendum)
 He has not been on Atorvastatin  20 mg for at least a year. Recommend resuming Atorvastatin  20 mg daily. Low fat diet also recommended.

## 2024-02-16 NOTE — Assessment & Plan Note (Signed)
 Last follow up a year ago. HgA1C today was 6.8 in 02/2023. Continue Empagliflozin -Metformin  09-998 mg bid but with food to prevent diarrhea. Shiela grout eye exam. Continue periodic dental preventive care and foot care. F/U in 6 months.

## 2024-03-05 LAB — OPHTHALMOLOGY REPORT-SCANNED

## 2024-05-06 ENCOUNTER — Other Ambulatory Visit: Payer: Self-pay | Admitting: Podiatry

## 2024-05-06 ENCOUNTER — Other Ambulatory Visit: Payer: Self-pay | Admitting: Family Medicine

## 2024-05-06 DIAGNOSIS — B001 Herpesviral vesicular dermatitis: Secondary | ICD-10-CM
